# Patient Record
Sex: Female | Born: 1970 | Race: White | Hispanic: No | Marital: Single
Health system: Midwestern US, Community
[De-identification: ages and names within clinical notes are randomized; demographics above are authoritative.]

## PROBLEM LIST (undated history)

## (undated) DIAGNOSIS — S069X9A Unspecified intracranial injury with loss of consciousness of unspecified duration, initial encounter: Secondary | ICD-10-CM

## (undated) DIAGNOSIS — E039 Hypothyroidism, unspecified: Secondary | ICD-10-CM

## (undated) DIAGNOSIS — N200 Calculus of kidney: Secondary | ICD-10-CM

## (undated) DIAGNOSIS — J189 Pneumonia, unspecified organism: Secondary | ICD-10-CM

## (undated) DIAGNOSIS — F4310 Post-traumatic stress disorder, unspecified: Secondary | ICD-10-CM

## (undated) DIAGNOSIS — Z87442 Personal history of urinary calculi: Secondary | ICD-10-CM

## (undated) DIAGNOSIS — T8859XA Other complications of anesthesia, initial encounter: Secondary | ICD-10-CM

## (undated) DIAGNOSIS — R519 Headache, unspecified: Secondary | ICD-10-CM

## (undated) DIAGNOSIS — E161 Other hypoglycemia: Secondary | ICD-10-CM

## (undated) HISTORY — PX: CHOLECYSTECTOMY: SHX55

## (undated) HISTORY — PX: ROOT CANAL: SHX2363

## (undated) HISTORY — PX: KNEE SURGERY: SHX244

## (undated) HISTORY — PX: TONSILLECTOMY: SUR1361

## (undated) HISTORY — PX: MIDDLE EAR SURGERY: SHX713

---
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
## 2012-03-09 HISTORY — PX: CHOLECYSTECTOMY: SHX55

## 2017-11-19 ENCOUNTER — Emergency Department (HOSPITAL_COMMUNITY)
Admission: EM | Admit: 2017-11-19 | Discharge: 2017-11-19 | Disposition: A | Discharge status: Home / Self Care | Payer: Medicare Other | Attending: Emergency Medicine | Admitting: Emergency Medicine

## 2017-11-19 ENCOUNTER — Emergency Department (HOSPITAL_COMMUNITY): Payer: Medicare Other

## 2017-11-19 ENCOUNTER — Encounter (HOSPITAL_COMMUNITY): Payer: Self-pay | Admitting: Emergency Medicine

## 2017-11-19 ENCOUNTER — Ambulatory Visit (HOSPITAL_COMMUNITY)
Admission: EM | Admit: 2017-11-19 | Discharge: 2017-11-19 | Disposition: A | Discharge status: Home / Self Care | Payer: Self-pay

## 2017-11-19 ENCOUNTER — Other Ambulatory Visit: Payer: Self-pay

## 2017-11-19 DIAGNOSIS — R319 Hematuria, unspecified: Secondary | ICD-10-CM

## 2017-11-19 DIAGNOSIS — F17200 Nicotine dependence, unspecified, uncomplicated: Secondary | ICD-10-CM | POA: Insufficient documentation

## 2017-11-19 DIAGNOSIS — N200 Calculus of kidney: Secondary | ICD-10-CM | POA: Diagnosis not present

## 2017-11-19 DIAGNOSIS — N390 Urinary tract infection, site not specified: Secondary | ICD-10-CM | POA: Diagnosis not present

## 2017-11-19 DIAGNOSIS — R109 Unspecified abdominal pain: Secondary | ICD-10-CM | POA: Diagnosis present

## 2017-11-19 DIAGNOSIS — Z9049 Acquired absence of other specified parts of digestive tract: Secondary | ICD-10-CM | POA: Diagnosis not present

## 2017-11-19 HISTORY — DX: Post-traumatic stress disorder, unspecified: F43.10

## 2017-11-19 HISTORY — DX: Calculus of kidney: N20.0

## 2017-11-19 HISTORY — DX: Unspecified intracranial injury with loss of consciousness of unspecified duration, initial encounter: S06.9X9A

## 2017-11-19 LAB — URINALYSIS, ROUTINE W REFLEX MICROSCOPIC
Glucose, UA: NEGATIVE mg/dL
KETONES UR: NEGATIVE mg/dL
Nitrite: NEGATIVE
PH: 5.5 (ref 5.0–8.0)
PROTEIN: 30 mg/dL — AB
Specific Gravity, Urine: >1.03 — ABNORMAL HIGH (ref 1.005–1.030)

## 2017-11-19 LAB — CBC WITH DIFFERENTIAL/PLATELET
Abs Immature Granulocytes: 0.1 10*3/uL (ref 0.0–0.1)
BASOS ABS: 0.1 10*3/uL (ref 0.0–0.1)
BASOS PCT: 1 %
EOS PCT: 3 %
Eosinophils Absolute: 0.3 10*3/uL (ref 0.0–0.7)
HCT: 50.3 % — ABNORMAL HIGH (ref 36.0–46.0)
Hemoglobin: 16.8 g/dL — ABNORMAL HIGH (ref 12.0–15.0)
Immature Granulocytes: 1 %
Lymphocytes Relative: 28 %
Lymphs Abs: 3.1 10*3/uL (ref 0.7–4.0)
MCH: 31.2 pg (ref 26.0–34.0)
MCHC: 33.4 g/dL (ref 30.0–36.0)
MCV: 93.3 fL (ref 78.0–100.0)
MONO ABS: 0.6 10*3/uL (ref 0.1–1.0)
Monocytes Relative: 5 %
Neutro Abs: 6.9 10*3/uL (ref 1.7–7.7)
Neutrophils Relative %: 62 %
PLATELETS: 305 10*3/uL (ref 150–400)
RBC: 5.39 MIL/uL — AB (ref 3.87–5.11)
RDW: 13.4 % (ref 11.5–15.5)
WBC: 11 10*3/uL — AB (ref 4.0–10.5)

## 2017-11-19 LAB — I-STAT BETA HCG BLOOD, ED (MC, WL, AP ONLY)

## 2017-11-19 LAB — BASIC METABOLIC PANEL
Anion gap: 11 (ref 5–15)
BUN: 10 mg/dL (ref 6–20)
CHLORIDE: 109 mmol/L (ref 98–111)
CO2: 21 mmol/L — ABNORMAL LOW (ref 22–32)
Calcium: 9.7 mg/dL (ref 8.9–10.3)
Creatinine, Ser: 0.99 mg/dL (ref 0.44–1.00)
GFR calc non Af Amer: >60 mL/min (ref >60)
Glucose, Bld: 111 mg/dL — ABNORMAL HIGH (ref 70–99)
Potassium: 4.5 mmol/L (ref 3.5–5.1)
SODIUM: 141 mmol/L (ref 135–145)

## 2017-11-19 LAB — URINALYSIS, MICROSCOPIC (REFLEX)

## 2017-11-19 MED ORDER — KETOROLAC TROMETHAMINE 30 MG/ML IJ SOLN
30.0000 mg | Freq: Once | INTRAMUSCULAR | Status: AC
  Administered 2017-11-19: 30 mg via INTRAMUSCULAR
  Filled 2017-11-19: qty 1

## 2017-11-19 MED ORDER — NITROFURANTOIN MONOHYD MACRO 100 MG PO CAPS: 100.0000 mg | ORAL_CAPSULE | Freq: Two times a day (BID) | ORAL | 0 refills | Status: DC

## 2017-11-19 MED ORDER — CEPHALEXIN 500 MG PO CAPS: 500.0000 mg | ORAL_CAPSULE | Freq: Four times a day (QID) | ORAL | 0 refills | Status: DC

## 2017-11-19 MED ORDER — ONDANSETRON HCL 4 MG PO TABS: 4.0000 mg | ORAL_TABLET | Freq: Three times a day (TID) | ORAL | 0 refills | Status: DC | PRN

## 2017-11-19 MED ORDER — OXYCODONE-ACETAMINOPHEN 5-325 MG PO TABS: 1.0000 | ORAL_TABLET | ORAL | 0 refills | Status: DC | PRN

## 2017-11-19 MED ORDER — KETOROLAC TROMETHAMINE 10 MG PO TABS: 10.0000 mg | ORAL_TABLET | Freq: Four times a day (QID) | ORAL | 0 refills | Status: DC | PRN

## 2017-11-19 MED ORDER — TAMSULOSIN HCL 0.4 MG PO CAPS: 0.4000 mg | ORAL_CAPSULE | Freq: Every day | ORAL | 0 refills | Status: DC

## 2017-11-19 NOTE — ED Triage Notes (Signed)
Pt reports burning with urination and hematuria. Pt reports LLQ pain. Pt reports hx of kidney stones. Pt reports nausea, burping, denies vomiting.

## 2017-11-19 NOTE — ED Provider Notes (Signed)
Patient placed in Quick Look pathway, seen and evaluated   Chief Complaint: Left flank pain  HPI: Patient with history of kidney stones presents with acute onset of left lateral flank pain with associated nausea.  No vomiting or fever.  Symptoms remind her of previous kidney stones.  She noted some dark-colored urine yesterday.  She has been treating at home with fluids, cranberry juice, and apple cider vinegar.  ROS:  Positive ROS: (+) Leg pain, nausea Negative ROS: (-) Fever, dysuria, vomiting  Physical Exam:   Gen: No distress  Neuro: Awake and Alert  Skin: Warm    Focused Exam: Heart RRR, nml S1,S2, no m/r/g; Lungs CTAB; Abd soft, mild left lateral abdominal and flank tenderness, no rebound or guarding; Ext 2+ pedal pulses bilaterally, no edema.  BP 112/81 (BP Location: Right Arm)   Pulse (!) 104   Temp (!) 97.5 F (36.4 C) (Oral)   Resp 18   SpO2 96%   Plan: Labs, UA to rule out infection, CT renal.  Initiation of care has begun. The patient has been counseled on the process, plan, and necessity for staying for the completion/evaluation, and the remainder of the medical screening examination    Renne CriglerGeiple, Jaylee Freeze, Cordelia Poche-C 11/19/17 1648    Mesner, Barbara CowerJason, MD 11/19/17 2224

## 2017-11-19 NOTE — ED Notes (Signed)
Pt presents with severe abdominal pain, [facility]

## 2017-11-19 NOTE — ED Provider Notes (Signed)
Emergency Department Provider Note   I have reviewed the triage vital signs and the nursing notes.   HISTORY  Chief Complaint Abdominal Pain   HPI Rebecca Romero is a [age] y.o. female history of kidney stones presents the emergency department today with symptoms consistent with previous kidney stones.  Patient states that she started having urinary tract infection symptoms a few days ago took some cranberry juice but does seem to get better than earlier today showed acute onset of severe back pain that radiated around to her groin.  States it was similar to previous episodes of kidney stones and could not tolerate it so she came here for further evaluation.  At this time she is Artie has some Toradol and she states her symptoms are totally resolved besides the urinary symptoms.  Has some nausea but no vomiting.  No fevers.  She has had some malaise throughout the week but is unclear why.  Has had some intermittent change in urine color but the last time she had it was clear. No other associated or modifying symptoms.    Past Medical History:  Diagnosis Date  . Kidney stone   . PTSD (post-traumatic stress disorder)   . TBI (traumatic brain injury) (HCC)     There are no active problems to display for this patient.   Past Surgical History:  Procedure Laterality Date  . CHOLECYSTECTOMY    . KNEE SURGERY    . MIDDLE EAR SURGERY    . TONSILLECTOMY      Current Outpatient Rx  . Order #: [id number] Class: Print  . Order #: [id number] Class: Print  . Order #: [id number] Class: Print  . Order #: [id number] Class: Print  . Order #: [id number] Class: Print    Allergies Vistaril [hydroxyzine hcl]; Gabapentin; Hydrocodone; Morphine and related; Penicillins; and Sulfa antibiotics  History reviewed. No pertinent family history.  Social History Social History   Tobacco Use  . Smoking status: Current Every Day Smoker    Packs/day: 0.50  . Smokeless tobacco: Never Used  Substance Use  Topics  . Alcohol use: Yes    Comment: occasionally  . Drug use: Never    Review of Systems  All other systems negative except as documented in the HPI. All pertinent positives and negatives as reviewed in the HPI. ____________________________________________   PHYSICAL EXAM:  VITAL SIGNS: ED Triage Vitals  Enc Vitals Group     BP 11/19/17 1634 112/81     Pulse Rate 11/19/17 1634 (!) 104     Resp 11/19/17 1634 18     Temp 11/19/17 1634 (!) 97.5 F (36.4 C)     Temp Source 11/19/17 1634 Oral     SpO2 11/19/17 1634 96 %     Weight --      Height --      Head Circumference --      Peak Flow --      Pain Score 11/19/17 1638 10     Pain Loc --      Pain Edu? --      Excl. in GC? --     Constitutional: Alert and oriented. Well appearing and in no acute distress. Eyes: Conjunctivae are normal. PERRL. EOMI. Head: Atraumatic. Nose: No congestion/rhinnorhea. Mouth/Throat: Mucous membranes are moist.  Oropharynx non-erythematous. Neck: No stridor.  No meningeal signs.   Cardiovascular: Normal rate, regular rhythm. Good peripheral circulation. Grossly normal heart sounds.   Respiratory: Normal respiratory effort.  No retractions. Lungs CTAB. Gastrointestinal: Soft and nontender. No distention.  Musculoskeletal: No lower extremity tenderness nor edema. No gross deformities of extremities. Neurologic:  Normal speech and language. No gross focal neurologic deficits are appreciated.  Skin:  Skin is warm, dry and intact. No rash noted.   ____________________________________________   LABS (all labs ordered are listed, but only abnormal results are displayed)  Labs Reviewed  CBC WITH DIFFERENTIAL/PLATELET - Abnormal; Notable for the following components:      Result Value   WBC 11.0 (*)    RBC 5.39 (*)    Hemoglobin 16.8 (*)    HCT 50.3 (*)    All other components within normal limits  BASIC METABOLIC PANEL - Abnormal; Notable for the following components:   CO2 21 (*)     Glucose, Bld 111 (*)    All other components within normal limits  URINALYSIS, ROUTINE W REFLEX MICROSCOPIC - Abnormal; Notable for the following components:   APPearance CLOUDY (*)    Specific Gravity, Urine >1.030 (*)    Hgb urine dipstick LARGE (*)    Bilirubin Urine SMALL (*)    Protein, ur 30 (*)    Leukocytes, UA TRACE (*)    All other components within normal limits  URINALYSIS, MICROSCOPIC (REFLEX) - Abnormal; Notable for the following components:   Bacteria, UA FEW (*)    All other components within normal limits  I-STAT BETA HCG BLOOD, ED ([facility], [facility], [facility] ONLY)   ____________________________________________  RADIOLOGY  Ct Renal Stone Study  Result Date: 11/19/2017 CLINICAL DATA:  Flank pain.  Suspect stone disease. EXAM: CT ABDOMEN AND PELVIS WITHOUT CONTRAST TECHNIQUE: Multidetector CT imaging of the abdomen and pelvis was performed following the standard protocol without IV contrast. COMPARISON:  None FINDINGS: Lower chest: No acute abnormality. Hepatobiliary: No focal liver abnormality is seen. Status post cholecystectomy. No biliary dilatation. Pancreas: Unremarkable. No pancreatic ductal dilatation or surrounding inflammatory changes. Spleen: Multiple calcified granulomas. Otherwise normal size and appearance Adrenals/Urinary Tract: The adrenal glands appear normal. The right kidney is normal. No kidney stone or hydronephrosis. Mild left-sided nephromegaly and pelvocaliectasis noted. There is a stone at the left UPJ measuring 5 mm, image 57/6. Urinary bladder appears normal. Stomach/Bowel: Stomach is unremarkable. The small bowel loops have a normal course and caliber. Lipoma within the transverse colon is identified measuring 2 cm, image 40/3. Vascular/Lymphatic: Mild aortic atherosclerosis. No aneurysm. No upper abdominal or pelvic adenopathy identified. Reproductive: Right ovary dermoid measures 4.0 by 4.1 by 4.9 cm. Left ovary appears normal. Uterus is unremarkable. Other: No free  fluid or fluid collections. Musculoskeletal: No acute or significant osseous findings. IMPRESSION: 1. Left UPJ calculus is identified measuring 5 mm. This causes left-sided pelvocaliectasis and mild nephromegaly. 2. Right ovary dermoid 3.  Aortic Atherosclerosis (ICD10-I70.0). Electronically Signed   By: Signa Kell M.D.   On: 11/19/2017 17:43    ____________________________________________  INITIAL IMPRESSION / ASSESSMENT AND PLAN / ED COURSE  UTI with pyelonephritis.  Appears uncomplicated at this point.  5 mm to give urology's number in case it does not come out.  Will return here if anything worsens.  Pertinent labs & imaging results that were available during my care of the patient were reviewed by me and considered in my medical decision making (see chart for details).  ____________________________________________  FINAL CLINICAL IMPRESSION(S) / ED DIAGNOSES  Final diagnoses:  Urinary tract infection with hematuria, site unspecified  Kidney stone     MEDICATIONS GIVEN DURING THIS VISIT:  Medications  ketorolac (TORADOL) 30 MG/ML injection 30 mg (30 mg  Intramuscular Given 11/19/17 1908)     NEW OUTPATIENT MEDICATIONS STARTED DURING THIS VISIT:  New Prescriptions   KETOROLAC (TORADOL) 10 MG TABLET    Take 1 tablet (10 mg total) by mouth every 6 (six) hours as needed.   NITROFURANTOIN, MACROCRYSTAL-MONOHYDRATE, (MACROBID) 100 MG CAPSULE    Take 1 capsule (100 mg total) by mouth 2 (two) times daily. X 7 days   ONDANSETRON (ZOFRAN) 4 MG TABLET    Take 1 tablet (4 mg total) by mouth every 8 (eight) hours as needed for nausea or vomiting.   OXYCODONE-ACETAMINOPHEN (PERCOCET) 5-325 MG TABLET    Take 1 tablet by mouth every 4 (four) hours as needed.   TAMSULOSIN (FLOMAX) 0.4 MG CAPS CAPSULE    Take 1 capsule (0.4 mg total) by mouth daily.    Note:  This note was prepared with assistance of Dragon voice recognition software. Occasional wrong-word or sound-a-like substitutions may  have occurred due to the inherent limitations of voice recognition software.   Marily Memos, MD 11/19/17 2226

## 2017-11-24 ENCOUNTER — Other Ambulatory Visit: Payer: Self-pay | Admitting: Urology

## 2017-11-24 ENCOUNTER — Encounter (HOSPITAL_COMMUNITY): Payer: Self-pay | Admitting: General Practice

## 2017-11-29 ENCOUNTER — Ambulatory Visit (HOSPITAL_COMMUNITY): Payer: Medicare Other

## 2017-11-29 ENCOUNTER — Other Ambulatory Visit: Payer: Self-pay

## 2017-11-29 ENCOUNTER — Encounter (HOSPITAL_COMMUNITY): Payer: Self-pay | Admitting: *Deleted

## 2017-11-29 ENCOUNTER — Encounter (HOSPITAL_COMMUNITY)
Admission: RE | Disposition: A | Discharge status: Home / Self Care | Payer: Self-pay | Source: Ambulatory Visit | Attending: Urology

## 2017-11-29 ENCOUNTER — Ambulatory Visit (HOSPITAL_COMMUNITY)
Admission: RE | Admit: 2017-11-29 | Discharge: 2017-11-29 | Disposition: A | Discharge status: Home / Self Care | Payer: Medicare Other | Source: Ambulatory Visit | Attending: Urology | Admitting: Urology

## 2017-11-29 DIAGNOSIS — Z885 Allergy status to narcotic agent status: Secondary | ICD-10-CM | POA: Diagnosis not present

## 2017-11-29 DIAGNOSIS — J45909 Unspecified asthma, uncomplicated: Secondary | ICD-10-CM | POA: Diagnosis not present

## 2017-11-29 DIAGNOSIS — E039 Hypothyroidism, unspecified: Secondary | ICD-10-CM | POA: Insufficient documentation

## 2017-11-29 DIAGNOSIS — Z881 Allergy status to other antibiotic agents status: Secondary | ICD-10-CM | POA: Diagnosis not present

## 2017-11-29 DIAGNOSIS — Z791 Long term (current) use of non-steroidal anti-inflammatories (NSAID): Secondary | ICD-10-CM | POA: Insufficient documentation

## 2017-11-29 DIAGNOSIS — Z87820 Personal history of traumatic brain injury: Secondary | ICD-10-CM | POA: Insufficient documentation

## 2017-11-29 DIAGNOSIS — Z888 Allergy status to other drugs, medicaments and biological substances status: Secondary | ICD-10-CM | POA: Insufficient documentation

## 2017-11-29 DIAGNOSIS — Z880 Allergy status to penicillin: Secondary | ICD-10-CM | POA: Diagnosis not present

## 2017-11-29 DIAGNOSIS — F4310 Post-traumatic stress disorder, unspecified: Secondary | ICD-10-CM | POA: Insufficient documentation

## 2017-11-29 DIAGNOSIS — N201 Calculus of ureter: Secondary | ICD-10-CM | POA: Diagnosis present

## 2017-11-29 DIAGNOSIS — Z7901 Long term (current) use of anticoagulants: Secondary | ICD-10-CM | POA: Insufficient documentation

## 2017-11-29 DIAGNOSIS — Z882 Allergy status to sulfonamides status: Secondary | ICD-10-CM | POA: Diagnosis not present

## 2017-11-29 HISTORY — DX: Personal history of urinary calculi: Z87.442

## 2017-11-29 HISTORY — DX: Other hypoglycemia: E16.1

## 2017-11-29 HISTORY — PX: EXTRACORPOREAL SHOCK WAVE LITHOTRIPSY: SHX1557

## 2017-11-29 HISTORY — DX: Hypothyroidism, unspecified: E03.9

## 2017-11-29 LAB — PREGNANCY, URINE: Preg Test, Ur: NEGATIVE

## 2017-11-29 SURGERY — LITHOTRIPSY, ESWL
Anesthesia: LOCAL | Laterality: Left

## 2017-11-29 MED ORDER — DIAZEPAM 5 MG PO TABS
10.0000 mg | ORAL_TABLET | ORAL | Status: AC
  Administered 2017-11-29: 10 mg via ORAL
  Filled 2017-11-29: qty 2

## 2017-11-29 MED ORDER — SODIUM CHLORIDE 0.9 % IV SOLN
INTRAVENOUS | Status: DC
  Administered 2017-11-29: 10:00:00 via INTRAVENOUS

## 2017-11-29 MED ORDER — CIPROFLOXACIN HCL 500 MG PO TABS
500.0000 mg | ORAL_TABLET | ORAL | Status: AC
  Administered 2017-11-29: 500 mg via ORAL
  Filled 2017-11-29: qty 1

## 2017-11-29 MED ORDER — HYDROMORPHONE HCL 1 MG/ML IJ SOLN: 1.0000 mg | Freq: Once | INTRAMUSCULAR | Status: DC

## 2017-11-29 MED ORDER — HYDROMORPHONE HCL 1 MG/ML IJ SOLN: 1.0000 mg | Freq: Once | INTRAMUSCULAR | Status: DC | PRN

## 2017-11-29 MED ORDER — DIPHENHYDRAMINE HCL 25 MG PO CAPS
25.0000 mg | ORAL_CAPSULE | ORAL | Status: AC
  Administered 2017-11-29: 25 mg via ORAL
  Filled 2017-11-29: qty 1

## 2017-11-29 NOTE — Op Note (Signed)
See [facility] OP note scanned into chart. Also because of the size, density, location and other factors that cannot be anticipated I feel this will likely be a staged procedure. This fact supersedes any indication in the scanned [facility] operative note to the contrary.  

## 2017-11-29 NOTE — H&P (Signed)
See scanned H&P

## 2017-11-30 ENCOUNTER — Encounter (HOSPITAL_COMMUNITY): Payer: Self-pay | Admitting: Urology

## 2019-03-02 ENCOUNTER — Ambulatory Visit: Payer: Medicare Other | Attending: Internal Medicine

## 2019-03-02 DIAGNOSIS — Z20822 Contact with and (suspected) exposure to covid-19: Secondary | ICD-10-CM

## 2019-03-04 LAB — NOVEL CORONAVIRUS, NAA: SARS-CoV-2, NAA: NOT DETECTED

## 2019-10-13 ENCOUNTER — Ambulatory Visit: Payer: Self-pay | Admitting: Surgery
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
## 2019-10-14 ENCOUNTER — Other Ambulatory Visit (HOSPITAL_COMMUNITY)
Admission: RE | Admit: 2019-10-14 | Discharge: 2019-10-14 | Disposition: A | Discharge status: Home / Self Care | Payer: Medicare HMO | Source: Ambulatory Visit | Attending: Orthopedic Surgery | Admitting: Orthopedic Surgery

## 2019-10-14 DIAGNOSIS — Z01812 Encounter for preprocedural laboratory examination: Secondary | ICD-10-CM | POA: Diagnosis present

## 2019-10-14 DIAGNOSIS — Z20822 Contact with and (suspected) exposure to covid-19: Secondary | ICD-10-CM | POA: Insufficient documentation

## 2019-10-14 LAB — SARS CORONAVIRUS 2 (TAT 6-24 HRS): SARS Coronavirus 2: NEGATIVE

## 2019-10-16 ENCOUNTER — Encounter (HOSPITAL_COMMUNITY): Payer: Self-pay
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
## 2019-10-16 NOTE — Patient Instructions (Signed)
DUE TO COVID-19 ONLY ONE VISITOR IS ALLOWED TO COME WITH YOU AND STAY IN THE WAITING ROOM ONLY DURING PRE OP AND PROCEDURE DAY OF SURGERY. THE 1 VISITOR  MAY VISIT WITH YOU AFTER SURGERY IN YOUR PRIVATE ROOM DURING VISITING HOURS ONLY!  YOU NEED TO HAVE A COVID 19 TEST ON_8-7______ @_______ , THIS TEST MUST BE DONE BEFORE SURGERY,  COVID TESTING SITE [address] [place] , IT IS ON THE RIGHT GOING OUT [address] APPROXIMATELY  2 MINUTES PAST [employer] ON THE RIGHT. ONCE YOUR COVID TEST IS COMPLETED,  PLEASE BEGIN THE QUARANTINE INSTRUCTIONS AS OUTLINED IN YOUR HANDOUT.                Rebecca Romero  10/16/2019   Your procedure is scheduled on: 10-18-19   Report to [facility] Main  Entrance   Report to admitting at       1020 AM     Call this number if you have problems the morning of surgery [phone]    Remember: ONE FLEETS ENEMA THE NIGHT BEFORE SURGERY                                       ONE FLEETS ENEMA THE AM OF SURGERY  Do not eat food or drink liquids :After Midnight.   BRUSH YOUR TEETH MORNING OF SURGERY AND RINSE YOUR MOUTH OUT, NO CHEWING GUM CANDY OR MINTS.     Take these medicines the morning of surgery with A SIP OF WATER: NONE                                 You may not have any metal on your body including hair pins and              piercings  Do not wear jewelry, make-up, lotions, powders or perfumes, deodorant             Do not wear nail polish on your fingernails.  Do not shave  48 hours prior to surgery.     Do not bring valuables to the hospital. [name] IS NOT             RESPONSIBLE   FOR VALUABLES.  Contacts, dentures or bridgework may not be worn into surgery.      Patients discharged the day of surgery will not be allowed to drive home. IF YOU ARE HAVING SURGERY AND GOING HOME THE SAME DAY, YOU MUST HAVE AN ADULT TO DRIVE YOU HOME AND BE WITH YOU FOR 24 HOURS. YOU MAY GO HOME BY TAXI OR UBER OR  ORTHERWISE, BUT AN ADULT MUST ACCOMPANY YOU HOME AND STAY WITH YOU FOR 24 HOURS.  Name and phone number of your driver:  Special Instructions: N/A              Please read over the following fact sheets you were given: _____________________________________________________________________             [facility] - Preparing for Surgery Before surgery, you can play an important role.  Because skin is not sterile, your skin needs to be as free of germs as possible.  You can reduce the number of germs on your skin by washing with CHG (chlorahexidine gluconate) soap before surgery.  CHG is an antiseptic cleaner which kills  germs and bonds with the skin to continue killing germs even after washing. Please DO NOT use if you have an allergy to CHG or antibacterial soaps.  If your skin becomes reddened/irritated stop using the CHG and inform your nurse when you arrive at Short Stay. Do not shave (including legs and underarms) for at least 48 hours prior to the first CHG shower.  You may shave your face/neck. Please follow these instructions carefully:  1.  Shower with CHG Soap the night before surgery and the  morning of Surgery.  2.  If you choose to wash your hair, wash your hair first as usual with your  normal  shampoo.  3.  After you shampoo, rinse your hair and body thoroughly to remove the  shampoo.                           4.  Use CHG as you would any other liquid soap.  You can apply chg directly  to the skin and wash                       Gently with a scrungie or clean washcloth.  5.  Apply the CHG Soap to your body ONLY FROM THE NECK DOWN.   Do not use on face/ open                           Wound or open sores. Avoid contact with eyes, ears mouth and genitals (private parts).                       Wash face,  Genitals (private parts) with your normal soap.             6.  Wash thoroughly, paying special attention to the area where your surgery  will be performed.  7.  Thoroughly rinse  your body with warm water from the neck down.  8.  DO NOT shower/wash with your normal soap after using and rinsing off  the CHG Soap.                9.  Pat yourself dry with a clean towel.            10.  Wear clean pajamas.            11.  Place clean sheets on your bed the night of your first shower and do not  sleep with pets. Day of Surgery : Do not apply any lotions/deodorants the morning of surgery.  Please wear clean clothes to the hospital/surgery center.  FAILURE TO FOLLOW THESE INSTRUCTIONS MAY RESULT IN THE CANCELLATION OF YOUR SURGERY PATIENT SIGNATURE_________________________________  NURSE SIGNATURE__________________________________  ________________________________________________________________________

## 2019-10-17 ENCOUNTER — Encounter (HOSPITAL_COMMUNITY)
Admission: RE | Admit: 2019-10-17 | Discharge: 2019-10-17 | Disposition: A | Discharge status: Home / Self Care | Payer: Medicare HMO | Source: Ambulatory Visit | Attending: Surgery | Admitting: Surgery

## 2019-10-17 ENCOUNTER — Other Ambulatory Visit: Payer: Self-pay

## 2019-10-17 ENCOUNTER — Encounter (HOSPITAL_COMMUNITY): Payer: Self-pay

## 2019-10-17 DIAGNOSIS — Z01812 Encounter for preprocedural laboratory examination: Secondary | ICD-10-CM | POA: Insufficient documentation

## 2019-10-17 HISTORY — DX: Pneumonia, unspecified organism: J18.9

## 2019-10-17 HISTORY — DX: Other complications of anesthesia, initial encounter: T88.59XA

## 2019-10-17 LAB — CBC WITH DIFFERENTIAL/PLATELET
Abs Immature Granulocytes: 0.06 10*3/uL (ref 0.00–0.07)
Basophils Absolute: 0.1 10*3/uL (ref 0.0–0.1)
Basophils Relative: 1 %
Eosinophils Absolute: 0.3 10*3/uL (ref 0.0–0.5)
Eosinophils Relative: 3 %
HCT: 47.2 % — ABNORMAL HIGH (ref 36.0–46.0)
Hemoglobin: 15.8 g/dL — ABNORMAL HIGH (ref 12.0–15.0)
Immature Granulocytes: 1 %
Lymphocytes Relative: 31 %
Lymphs Abs: 3.3 10*3/uL (ref 0.7–4.0)
MCH: 30.6 pg (ref 26.0–34.0)
MCHC: 33.5 g/dL (ref 30.0–36.0)
MCV: 91.3 fL (ref 80.0–100.0)
Monocytes Absolute: 0.7 10*3/uL (ref 0.1–1.0)
Monocytes Relative: 7 %
Neutro Abs: 6.1 10*3/uL (ref 1.7–7.7)
Neutrophils Relative %: 57 %
Platelets: 281 10*3/uL (ref 150–400)
RBC: 5.17 MIL/uL — ABNORMAL HIGH (ref 3.87–5.11)
RDW: 13.2 % (ref 11.5–15.5)
WBC: 10.6 10*3/uL — ABNORMAL HIGH (ref 4.0–10.5)
nRBC: 0 % (ref 0.0–0.2)

## 2019-10-17 LAB — PROTIME-INR
INR: 0.9 (ref 0.8–1.2)
Prothrombin Time: 11.5 seconds (ref 11.4–15.2)

## 2019-10-17 LAB — COMPREHENSIVE METABOLIC PANEL
ALT: 25 U/L (ref 0–44)
AST: 23 U/L (ref 15–41)
Albumin: 3.9 g/dL (ref 3.5–5.0)
Alkaline Phosphatase: 100 U/L (ref 38–126)
Anion gap: 12 (ref 5–15)
BUN: 19 mg/dL (ref 6–20)
CO2: 23 mmol/L (ref 22–32)
Calcium: 8.9 mg/dL (ref 8.9–10.3)
Chloride: 107 mmol/L (ref 98–111)
Creatinine, Ser: 0.8 mg/dL (ref 0.44–1.00)
GFR calc Af Amer: >60 mL/min (ref >60)
GFR calc non Af Amer: >60 mL/min (ref >60)
Glucose, Bld: 141 mg/dL — ABNORMAL HIGH (ref 70–99)
Potassium: 4.2 mmol/L (ref 3.5–5.1)
Sodium: 142 mmol/L (ref 135–145)
Total Bilirubin: 0.4 mg/dL (ref 0.3–1.2)
Total Protein: 7 g/dL (ref 6.5–8.1)

## 2019-10-17 LAB — APTT: aPTT: 39 seconds — ABNORMAL HIGH (ref 24–36)

## 2019-10-17 MED ORDER — BUPIVACAINE LIPOSOME 1.3 % IJ SUSP
20.0000 mL | Freq: Once | INTRAMUSCULAR | Status: DC
  Filled 2019-10-17: qty 20

## 2019-10-17 NOTE — Progress Notes (Signed)
Pt. Takes Ashwaganda for PTSD advised to stop today  per anesthesia and to call surgeon if any further questions. Per Jodell Cipro PA-C

## 2019-10-18 ENCOUNTER — Encounter (HOSPITAL_COMMUNITY): Payer: Self-pay | Admitting: Surgery

## 2019-10-18 ENCOUNTER — Ambulatory Visit (HOSPITAL_COMMUNITY): Payer: Medicare HMO | Admitting: Anesthesiology

## 2019-10-18 ENCOUNTER — Other Ambulatory Visit: Payer: Self-pay

## 2019-10-18 ENCOUNTER — Encounter (HOSPITAL_COMMUNITY)
Admission: RE | Disposition: A | Discharge status: Home / Self Care | Payer: Self-pay | Source: Home / Self Care | Attending: Surgery

## 2019-10-18 ENCOUNTER — Ambulatory Visit (HOSPITAL_COMMUNITY)
Admission: RE | Admit: 2019-10-18 | Discharge: 2019-10-18 | Disposition: A | Discharge status: Home / Self Care | Payer: Medicare HMO | Attending: Surgery | Admitting: Surgery

## 2019-10-18 DIAGNOSIS — K644 Residual hemorrhoidal skin tags: Secondary | ICD-10-CM | POA: Insufficient documentation

## 2019-10-18 DIAGNOSIS — Z885 Allergy status to narcotic agent status: Secondary | ICD-10-CM | POA: Diagnosis not present

## 2019-10-18 DIAGNOSIS — K648 Other hemorrhoids: Secondary | ICD-10-CM | POA: Diagnosis not present

## 2019-10-18 DIAGNOSIS — Z882 Allergy status to sulfonamides status: Secondary | ICD-10-CM | POA: Diagnosis not present

## 2019-10-18 DIAGNOSIS — Z87820 Personal history of traumatic brain injury: Secondary | ICD-10-CM | POA: Insufficient documentation

## 2019-10-18 DIAGNOSIS — K603 Anal fistula: Secondary | ICD-10-CM | POA: Insufficient documentation

## 2019-10-18 DIAGNOSIS — Z880 Allergy status to penicillin: Secondary | ICD-10-CM | POA: Insufficient documentation

## 2019-10-18 DIAGNOSIS — Z888 Allergy status to other drugs, medicaments and biological substances status: Secondary | ICD-10-CM | POA: Diagnosis not present

## 2019-10-18 DIAGNOSIS — Z881 Allergy status to other antibiotic agents status: Secondary | ICD-10-CM | POA: Insufficient documentation

## 2019-10-18 DIAGNOSIS — Z6841 Body Mass Index (BMI) 40.0 and over, adult: Secondary | ICD-10-CM | POA: Insufficient documentation

## 2019-10-18 DIAGNOSIS — F17210 Nicotine dependence, cigarettes, uncomplicated: Secondary | ICD-10-CM | POA: Insufficient documentation

## 2019-10-18 DIAGNOSIS — K610 Anal abscess: Secondary | ICD-10-CM | POA: Diagnosis present

## 2019-10-18 HISTORY — PX: RECTAL EXAM UNDER ANESTHESIA: SHX6399

## 2019-10-18 HISTORY — PX: PLACEMENT OF SETON: SHX6029

## 2019-10-18 LAB — PREGNANCY, URINE: Preg Test, Ur: NEGATIVE

## 2019-10-18 SURGERY — EXAM UNDER ANESTHESIA, RECTUM
Anesthesia: General

## 2019-10-18 MED ORDER — FENTANYL CITRATE (PF) 100 MCG/2ML IJ SOLN
INTRAMUSCULAR | Status: AC
  Filled 2019-10-18: qty 2

## 2019-10-18 MED ORDER — TRAMADOL HCL 50 MG PO TABS: 50.0000 mg | ORAL_TABLET | Freq: Four times a day (QID) | ORAL | 0 refills | Status: AC | PRN

## 2019-10-18 MED ORDER — SODIUM CHLORIDE 0.9 % IR SOLN
Status: DC | PRN
  Administered 2019-10-18: 1000 mL

## 2019-10-18 MED ORDER — ACETAMINOPHEN 500 MG PO TABS
1000.0000 mg | ORAL_TABLET | ORAL | Status: AC
  Administered 2019-10-18: 1000 mg via ORAL
  Filled 2019-10-18: qty 2

## 2019-10-18 MED ORDER — ONDANSETRON HCL 4 MG/2ML IJ SOLN
INTRAMUSCULAR | Status: AC
  Filled 2019-10-18: qty 2

## 2019-10-18 MED ORDER — HYDROGEN PEROXIDE 3 % EX SOLN
CUTANEOUS | Status: DC | PRN
  Administered 2019-10-18: 1

## 2019-10-18 MED ORDER — PROPOFOL 10 MG/ML IV BOLUS
INTRAVENOUS | Status: DC | PRN
  Administered 2019-10-18: 200 mg via INTRAVENOUS

## 2019-10-18 MED ORDER — IBUPROFEN 200 MG PO TABS
600.0000 mg | ORAL_TABLET | Freq: Once | ORAL | Status: AC | PRN
  Administered 2019-10-18: 600 mg via ORAL

## 2019-10-18 MED ORDER — CHLORHEXIDINE GLUCONATE 0.12 % MT SOLN
15.0000 mL | Freq: Once | OROMUCOSAL | Status: AC
  Administered 2019-10-18: 15 mL via OROMUCOSAL

## 2019-10-18 MED ORDER — BUPIVACAINE-EPINEPHRINE (PF) 0.25% -1:200000 IJ SOLN
INTRAMUSCULAR | Status: DC | PRN
  Administered 2019-10-18: 30 mL

## 2019-10-18 MED ORDER — ORAL CARE MOUTH RINSE: 15.0000 mL | Freq: Once | OROMUCOSAL | Status: AC

## 2019-10-18 MED ORDER — SUGAMMADEX SODIUM 500 MG/5ML IV SOLN
INTRAVENOUS | Status: AC
  Filled 2019-10-18: qty 5

## 2019-10-18 MED ORDER — DEXMEDETOMIDINE (PRECEDEX) IN NS 20 MCG/5ML (4 MCG/ML) IV SYRINGE
PREFILLED_SYRINGE | INTRAVENOUS | Status: AC
  Filled 2019-10-18: qty 5

## 2019-10-18 MED ORDER — BUPIVACAINE LIPOSOME 1.3 % IJ SUSP
INTRAMUSCULAR | Status: DC | PRN
  Administered 2019-10-18: 20 mL

## 2019-10-18 MED ORDER — LACTATED RINGERS IV SOLN: INTRAVENOUS | Status: DC

## 2019-10-18 MED ORDER — MIDAZOLAM HCL 2 MG/2ML IJ SOLN
INTRAMUSCULAR | Status: DC | PRN
  Administered 2019-10-18: 2 mg via INTRAVENOUS

## 2019-10-18 MED ORDER — LIDOCAINE 2% (20 MG/ML) 5 ML SYRINGE
INTRAMUSCULAR | Status: AC
  Filled 2019-10-18: qty 5

## 2019-10-18 MED ORDER — ONDANSETRON HCL 4 MG/2ML IJ SOLN
INTRAMUSCULAR | Status: DC | PRN
  Administered 2019-10-18: 4 mg via INTRAVENOUS

## 2019-10-18 MED ORDER — FENTANYL CITRATE (PF) 100 MCG/2ML IJ SOLN
INTRAMUSCULAR | Status: DC | PRN
  Administered 2019-10-18: 50 ug via INTRAVENOUS
  Administered 2019-10-18: 100 ug via INTRAVENOUS
  Administered 2019-10-18: 50 ug via INTRAVENOUS

## 2019-10-18 MED ORDER — HYDROMORPHONE HCL 1 MG/ML IJ SOLN: 0.2500 mg | INTRAMUSCULAR | Status: DC | PRN

## 2019-10-18 MED ORDER — ACETAMINOPHEN 325 MG PO TABS
650.0000 mg | ORAL_TABLET | Freq: Once | ORAL | Status: AC | PRN
  Administered 2019-10-18: 650 mg via ORAL

## 2019-10-18 MED ORDER — DEXAMETHASONE SODIUM PHOSPHATE 10 MG/ML IJ SOLN
INTRAMUSCULAR | Status: DC | PRN
  Administered 2019-10-18: 10 mg via INTRAVENOUS

## 2019-10-18 MED ORDER — ROCURONIUM BROMIDE 10 MG/ML (PF) SYRINGE
PREFILLED_SYRINGE | INTRAVENOUS | Status: DC | PRN
  Administered 2019-10-18: 50 mg via INTRAVENOUS

## 2019-10-18 MED ORDER — DEXMEDETOMIDINE (PRECEDEX) IN NS 20 MCG/5ML (4 MCG/ML) IV SYRINGE
PREFILLED_SYRINGE | INTRAVENOUS | Status: DC | PRN
  Administered 2019-10-18: 8 ug via INTRAVENOUS
  Administered 2019-10-18: 12 ug via INTRAVENOUS

## 2019-10-18 MED ORDER — HYDROGEN PEROXIDE 3 % EX SOLN
CUTANEOUS | Status: AC
  Filled 2019-10-18: qty 473

## 2019-10-18 MED ORDER — CHLORHEXIDINE GLUCONATE CLOTH 2 % EX PADS
6.0000 | MEDICATED_PAD | Freq: Once | CUTANEOUS | Status: AC
  Administered 2019-10-18: 6 via TOPICAL

## 2019-10-18 MED ORDER — DEXAMETHASONE SODIUM PHOSPHATE 10 MG/ML IJ SOLN
INTRAMUSCULAR | Status: AC
  Filled 2019-10-18: qty 1

## 2019-10-18 MED ORDER — PROMETHAZINE HCL 25 MG/ML IJ SOLN: 6.2500 mg | INTRAMUSCULAR | Status: DC | PRN

## 2019-10-18 MED ORDER — PROPOFOL 10 MG/ML IV BOLUS
INTRAVENOUS | Status: AC
  Filled 2019-10-18: qty 20

## 2019-10-18 MED ORDER — SUGAMMADEX SODIUM 200 MG/2ML IV SOLN
INTRAVENOUS | Status: DC | PRN
  Administered 2019-10-18: 300 mg via INTRAVENOUS

## 2019-10-18 MED ORDER — MIDAZOLAM HCL 2 MG/2ML IJ SOLN
INTRAMUSCULAR | Status: AC
  Filled 2019-10-18: qty 2

## 2019-10-18 MED ORDER — LIDOCAINE 2% (20 MG/ML) 5 ML SYRINGE
INTRAMUSCULAR | Status: DC | PRN
  Administered 2019-10-18: 100 mg via INTRAVENOUS

## 2019-10-18 MED ORDER — ACETAMINOPHEN 325 MG PO TABS
ORAL_TABLET | ORAL | Status: AC
  Filled 2019-10-18: qty 2

## 2019-10-18 MED ORDER — ROCURONIUM BROMIDE 10 MG/ML (PF) SYRINGE
PREFILLED_SYRINGE | INTRAVENOUS | Status: AC
  Filled 2019-10-18: qty 10

## 2019-10-18 MED ORDER — BUPIVACAINE-EPINEPHRINE (PF) 0.25% -1:200000 IJ SOLN
INTRAMUSCULAR | Status: AC
  Filled 2019-10-18: qty 30

## 2019-10-18 MED ORDER — IBUPROFEN 200 MG PO TABS
ORAL_TABLET | ORAL | Status: AC
  Filled 2019-10-18: qty 3

## 2019-10-18 SURGICAL SUPPLY — 25 items
BRIEF STRETCH FOR OB PAD LRG (UNDERPADS AND DIAPERS) ×2 IMPLANT
COVER WAND RF STERILE (DRAPES) IMPLANT
DECANTER SPIKE VIAL GLASS SM (MISCELLANEOUS) ×2 IMPLANT
DRSG PAD ABDOMINAL 8X10 ST (GAUZE/BANDAGES/DRESSINGS) ×2 IMPLANT
ELECT REM PT RETURN 15FT ADLT (MISCELLANEOUS) ×2 IMPLANT
GAUZE SPONGE 4X4 12PLY STRL (GAUZE/BANDAGES/DRESSINGS) ×2 IMPLANT
GLOVE BIO SURGEON STRL SZ7.5 (GLOVE) ×2 IMPLANT
GLOVE INDICATOR 8.0 STRL GRN (GLOVE) ×2 IMPLANT
GOWN STRL REUS W/TWL XL LVL3 (GOWN DISPOSABLE) ×4 IMPLANT
KIT BASIN OR (CUSTOM PROCEDURE TRAY) ×2 IMPLANT
KIT TURNOVER KIT A (KITS) ×2 IMPLANT
NEEDLE HYPO 22GX1.5 SAFETY (NEEDLE) ×2 IMPLANT
PACK GENERAL/GYN (CUSTOM PROCEDURE TRAY) ×2 IMPLANT
PENCIL SMOKE EVACUATOR (MISCELLANEOUS) ×2 IMPLANT
SHEARS HARMONIC 9CM CVD (BLADE) IMPLANT
SPONGE SURGIFOAM ABS GEL 100 (HEMOSTASIS) IMPLANT
SURGILUBE 2OZ TUBE FLIPTOP (MISCELLANEOUS) ×2 IMPLANT
SUT CHROMIC 2 0 SH (SUTURE) IMPLANT
SUT CHROMIC 3 0 SH 27 (SUTURE) IMPLANT
SUT MNCRL AB 4-0 PS2 18 (SUTURE) ×2 IMPLANT
SUT VIC AB 3-0 SH 27 (SUTURE) ×2
SUT VIC AB 3-0 SH 27X BRD (SUTURE) ×1 IMPLANT
SYR 20ML LL LF (SYRINGE) ×2 IMPLANT
TOWEL OR 17X26 10 PK STRL BLUE (TOWEL DISPOSABLE) ×2 IMPLANT
TOWEL OR NON WOVEN STRL DISP B (DISPOSABLE) ×2 IMPLANT

## 2019-10-18 NOTE — Op Note (Signed)
10/18/2019  12:58 PM  PATIENT:  Rebecca Romero  49 y.o. female  Patient Care Team: Patient, No Pcp Per as PCP - General (General Practice)  PRE-OPERATIVE DIAGNOSIS:  Probable anal fistula, history of perirectal abscess  POST-OPERATIVE DIAGNOSIS:  Low trans-sphincteric anal fistula  PROCEDURE:   1. Partial fistulotomy 2. Placement of seton 3. Anorectal exam under anesthesia  SURGEON:  Surgeon(s): Ileana Roup, MD  ANESTHESIA:   local and general  SPECIMEN:  No Specimen  DISPOSITION OF SPECIMEN:  N/A  COUNTS:  Sponge, needle, and instrument counts were reported correct x2 at conclusion.  EBL: 2 mL  Drains: None  PLAN OF CARE: Discharge to home after PACU  PATIENT DISPOSITION:  PACU - hemodynamically stable.  INDICATION: [name] is a pleasant 49yo female with a 4 to 5-year history of recurring abscesses on the right side of the buttock.  They were flaring up every 6 months.  She was seen in our office and had an abscess drained by one of my partners..  She was ultimately referred to see me for a possible perianal fistula.  We met and discussed everything in the office.  She does have evidence of what appears to be an external opening in the right posterolateral buttock.  We discussed options going forward and she opted to pursue surgery.  Please refer to notes elsewhere for details regarding these discussions.  OR FINDINGS: Normal-appearing anal canal without ulceration or obvious granulation tissue. Small external and internal hemorrhoids. External opening in the right posterior lateral buttock.  Ultimately, found to have a low transsphincteric fistula with internal opening in the posterior midline just distal to the dentate.  A partial fistulotomy was carried out.  A blue vessel loop seton was placed.  DESCRIPTION: The patient was identified in the preoperative holding area and taken to the OR. SCDs were applied. She then underwent general endotracheal  anesthesia without difficulty. The patient was then rolled onto the OR table in the prone jackknife position. Pressure points were then evaluated and padded. The buttocks were gently taped apart.  She was then prepped and draped in usual sterile fashion.  A surgical timeout was performed indicating the correct patient, procedure, and positioning.  A perianal block was then created using a dilute mixture of 0.25% Marcaine with epinephrine and Exparel.  After ascertaining an appropriate level of anesthesia had been achieved, a well lubricated digital rectal exam was performed. This demonstrated a normal anorectal exam.  A Hill-Ferguson anoscope was into the anal canal and circumferential anoscopy demonstrated a normal appearing anus without evident granulation tissue, stricture or ulcerations.  Small internal/external hemorrhoids.  The external opening to this fistula was located in the right posterior lateral buttock approximately 5 cm from the anal verge.  This was carefully probed multiple times with a fistula probe, taking care to not create any false passages.  I was unable to definitively identify the internal opening.  The probe was repositioned with a curvilinear approach.  I was still is unable to find an internal opening.  A small volume of dilute hydrogen peroxide was then instilled using an 18-gauge angiocatheter from the external opening and there was extravasation of peroxide in the posterior midline anal canal just distal to the dentate line.  I ultimately able to carefully cannulate this with the fistula probe.  The tract was palpated and ran fairly superficially towards the anal verge however as it approached the sphincter did dive into a low transsphincteric position.  A partial fistulotomy  was carried out with electrocautery.  No sphincter muscle was divided.  Given the findings, the decision was made to proceed with placement of seton to allow the tract to mature and any secondary tract to  ideally collapse if present.  A blue vessel loop seton was passed and secured to itself with 2-0 silk suture creating a draining seton.  The anus was irrigated and hemostasis verified.  A dressing consisting of 4 x 4's, ABD, and mesh underwear was placed.  She was then taken out of the prone position, rolled back onto a stretcher, awakened from anesthesia, extubated, and transported to PACU in satisfactory condition.  DISPOSITION: PACU in satisfactory condition.

## 2019-10-18 NOTE — Interval H&P Note (Signed)
History and Physical Interval Note:  10/18/2019 10:56 AM  Rebecca Romero  has presented today for surgery, with the diagnosis of ANAL ABCESS.  The various methods of treatment have been discussed with the patient and family. After consideration of risks, benefits and other options for treatment, the patient has consented to  Procedure(s): ANORECTAL EXAM UNDER ANESTHESIA (N/A) POSSIBLE SETON , POSSIBLE FISTULOTOMY (N/A) POSSIBLE INCISION AND DRAINAGE (N/A) as a surgical intervention.  The patient's history has been reviewed, patient examined, no change in status, stable for surgery.  I have reviewed the patient's chart and labs.  Questions were answered to the patient's satisfaction.     Stephanie Coup Marcelene Weidemann

## 2019-10-18 NOTE — Anesthesia Postprocedure Evaluation (Deleted)
Anesthesia Post Note  Patient: Rebecca Romero  Procedure(s) Performed: ANORECTAL EXAM UNDER ANESTHESIA (N/A ) draining SETON placement , partial FISTULOTOMY (N/A )     Patient location during evaluation: PACU Anesthesia Type: General Level of consciousness: awake and alert Pain management: pain level controlled Vital Signs Assessment: post-procedure vital signs reviewed and stable Respiratory status: spontaneous breathing, nonlabored ventilation, respiratory function stable and patient connected to nasal cannula oxygen Cardiovascular status: blood pressure returned to baseline and stable Postop Assessment: no apparent nausea or vomiting Anesthetic complications: no Comments: Patient only has someone to stay with her for 2 hours at home. Was brief case, patient not requiring narcotics. Totally awake. OK to go home after 4 hour stay in phase 2   No complications documented.  Last Vitals:  Vitals:   10/18/19 1542 10/18/19 1615  BP: 119/87 138/80  Pulse: 66 80  Resp: 16 16  Temp: 36.7 C 36.8 C  SpO2: 95% 97%    Last Pain:  Vitals:   10/18/19 1630  TempSrc:   PainSc: 1                  [name] S

## 2019-10-18 NOTE — Addendum Note (Signed)
Addendum  created 10/18/19 1715 by Eilene Ghazi, MD   Clinical Note Signed, Delete clinical note

## 2019-10-18 NOTE — Anesthesia Postprocedure Evaluation (Signed)
Anesthesia Post Note  Patient: Denasia Maddy  Procedure(s) Performed: ANORECTAL EXAM UNDER ANESTHESIA (N/A ) draining SETON placement , partial FISTULOTOMY (N/A )     Patient location during evaluation: PACU Anesthesia Type: General Level of consciousness: awake and alert Pain management: pain level controlled Vital Signs Assessment: post-procedure vital signs reviewed and stable Respiratory status: spontaneous breathing, nonlabored ventilation, respiratory function stable and patient connected to nasal cannula oxygen Cardiovascular status: blood pressure returned to baseline and stable Postop Assessment: no apparent nausea or vomiting Anesthetic complications: no Comments: Patient only has someone to stay with her for 2 hours once home. This was a brief case, patient is not requiring narcotics.  OK to go home after 4 hour  stay in PACU/phase 2.   Patient totally awake, OK to go home   No complications documented.  Last Vitals:  Vitals:   10/18/19 1542 10/18/19 1615  BP: 119/87 138/80  Pulse: 66 80  Resp: 16 16  Temp: 36.7 C 36.8 C  SpO2: 95% 97%    Last Pain:  Vitals:   10/18/19 1630  TempSrc:   PainSc: 1                  Tramain Gershman S

## 2019-10-18 NOTE — Discharge Instructions (Addendum)
ANORECTAL SURGERY: POST OP INSTRUCTIONS A draining seton was placed for this fistula. A 'partial' fistulotomy was carried out to shorten the tract length as well.   1. DIET: Follow a light bland diet the first 24 hours after arrival home, such as soup, liquids, crackers, etc.  Be sure to include lots of fluids daily.  Avoid fast food or heavy meals as your are more likely to get nauseated.  Eat a low fat diet the next few days after surgery.   2. Some bleeding with bowel movements is expected for the first couple of days but this should stop in between bowel movements  3. Take your usually prescribed home medications unless otherwise directed.  4. PAIN CONTROL: a. It is helpful to take an over-the-counter pain medication regularly for the first few days/weeks.  Choose from the following that works best for you: i. Ibuprofen (Advil, etc) Three 200mg  tabs every 6 hours as needed. ii. Acetaminophen (Tylenol, etc) 500-650mg  every 6 hours as needed iii. NOTE: You may take both of these medications together - most patients find it most helpful when alternating between the two (i.e. Ibuprofen at 6am, tylenol at 9am, ibuprofen at 12pm ...) b. A  prescription for pain medication may have been prescribed for you at discharge.  Take your pain medication as prescribed.  i. If you are having problems/concerns with the prescription medicine, please call for further advice.  5. Avoid getting constipated.  Between the surgery and the pain medications, it is common to experience some constipation.  Increasing fluid intake (64oz of water per day) and taking a fiber supplement (such as Metamucil, Citrucel, FiberCon) 1-2 times a day regularly will usually help prevent this problem from occurring.  Take Miralax (over the counter) 1-2x/day while taking a narcotic pain medication. If no bowel movement after 48hours, you may additionally take a laxative like a bottle of Milk of Magnesia which can be purchased over the  counter. Avoid enemas if possible as these are often painful.   6. Watch out for diarrhea.  If you have many loose bowel movements, simplify your diet to bland foods.  Stop any stool softeners and decrease your fiber supplement. If this worsens or does not improve, please call us.  7. Wash / shower every day.  If you were discharged with a dressing, you may remove this the day after your surgery. You may shower normally, getting soap/water on your wound, particularly after bowel movements.  8. Soaking in a warm bath filled a couple inches ("Sitz bath") is a great way to clean the area after a bowel movement and many patients find it is a way to soothe the area.  9. ACTIVITIES as tolerated:   a. You may resume regular (light) daily activities beginning the next day--such as daily self-care, walking, climbing stairs--gradually increasing activities as tolerated.  If you can walk 30 minutes without difficulty, it is safe to try more intense activity such as jogging, treadmill, bicycling, low-impact aerobics, etc. b. Refrain from any heavy lifting or straining for the first 2 weeks after your procedure, particularly if your surgery was for hemorrhoids. c. Avoid activities that make your pain worse d. You may drive when you are no longer taking prescription pain medication, you can comfortably wear a seatbelt, and you can safely maneuver your car and apply brakes.  10. FOLLOW UP in our office a. Please call [facility] at [phone] to set up an appointment to see your surgeon in the office for  a follow-up appointment approximately 2 weeks after your surgery. b. Make sure that you call for this appointment the day you arrive home to insure a convenient appointment time.  9. If you have disability or family leave forms that need to be completed, you may have them completed by your primary care physician's office; for return to work instructions, please ask our office staff and they will be happy to assist  you in obtaining this documentation   When to call us [phone]: 1. Poor pain control 2. Reactions / problems with new medications (rash/itching, etc)  3. Fever over 101.5 F (38.5 C) 4. Inability to urinate 5. Nausea/vomiting 6. Worsening swelling or bruising 7. Continued bleeding from incision. 8. Increased pain, redness, or drainage from the incision  The clinic staff is available to answer your questions during regular business hours (8:30am-5pm).  Please don't hesitate to call and ask to speak to one of our nurses for clinical concerns.   A surgeon from [facility] is always on call at the hospitals   If you have a medical emergency, go to the nearest emergency room or call 911.   [facility] [address], [place], [place]  [address] ? MAIN: [phone] FAX [phone] [email or web address]   General Anesthesia, Adult, Care After This sheet gives you information about how to care for yourself after your procedure. Your health care provider may also give you more specific instructions. If you have problems or questions, contact your health care provider. What can I expect after the procedure? After the procedure, the following side effects are common:  Pain or discomfort at the IV site.  Nausea.  Vomiting.  Sore throat.  Trouble concentrating.  Feeling cold or chills.  Weak or tired.  Sleepiness and fatigue.  Soreness and body aches. These side effects can affect parts of the body that were not involved in surgery. Follow these instructions at home:  For at least 24 hours after the procedure:  Have a responsible adult stay with you. It is important to have someone help care for you until you are awake and alert.  Rest as needed.  Do not: ? Participate in activities in which you could fall or become injured. ? Drive. ? Use heavy machinery. ? Drink alcohol. ? Take sleeping pills or medicines  that cause drowsiness. ? Make important decisions or sign legal documents. ? Take care of children on your own. Eating and drinking  Follow any instructions from your health care provider about eating or drinking restrictions.  When you feel hungry, start by eating small amounts of foods that are soft and easy to digest (bland), such as toast. Gradually return to your regular diet.  Drink enough fluid to keep your urine pale yellow.  If you vomit, rehydrate by drinking water, juice, or clear broth. General instructions  If you have sleep apnea, surgery and certain medicines can increase your risk for breathing problems. Follow instructions from your health care provider about wearing your sleep device: ? Anytime you are sleeping, including during daytime naps. ? While taking prescription pain medicines, sleeping medicines, or medicines that make you drowsy.  Return to your normal activities as told by your health care provider. Ask your health care provider what activities are safe for you.  Take over-the-counter and prescription medicines only as told by your health care provider.  If you smoke, do not smoke without supervision.  Keep all follow-up visits as told by  your health care provider. This is important. Contact a health care provider if:  You have nausea or vomiting that does not get better with medicine.  You cannot eat or drink without vomiting.  You have pain that does not get better with medicine.  You are unable to pass urine.  You develop a skin rash.  You have a fever.  You have redness around your IV site that gets worse. Get help right away if:  You have difficulty breathing.  You have chest pain.  You have blood in your urine or stool, or you vomit blood. Summary  After the procedure, it is common to have a sore throat or nausea. It is also common to feel tired.  Have a responsible adult stay with you for the first 24 hours after general  anesthesia. It is important to have someone help care for you until you are awake and alert.  When you feel hungry, start by eating small amounts of foods that are soft and easy to digest (bland), such as toast. Gradually return to your regular diet.  Drink enough fluid to keep your urine pale yellow.  Return to your normal activities as told by your health care provider. Ask your health care provider what activities are safe for you. This information is not intended to replace advice given to you by your health care provider. Make sure you discuss any questions you have with your health care provider. Document Revised: 02/26/2017 Document Reviewed: 10/09/2016 Elsevier Patient Education  2020 [employer].

## 2019-10-18 NOTE — Transfer of Care (Signed)
Immediate Anesthesia Transfer of Care Note  Patient: Rebecca Romero  Procedure(s) Performed: ANORECTAL EXAM UNDER ANESTHESIA (N/A ) draining SETON placement , partial FISTULOTOMY (N/A )  Patient Location: PACU  Anesthesia Type:General  Level of Consciousness: awake and alert   Airway & Oxygen Therapy: Patient Spontanous Breathing and Patient connected to face mask oxygen  Post-op Assessment: Report given to RN and Post -op Vital signs reviewed and stable  Post vital signs: Reviewed and stable  Last Vitals:  Vitals Value Taken Time  BP 138/90 10/18/19 1311  Temp    Pulse 94 10/18/19 1313  Resp 18 10/18/19 1313  SpO2 100 % 10/18/19 1313  Vitals shown include unvalidated device data.  Last Pain:  Vitals:   10/18/19 1135  TempSrc:   PainSc: 0-No pain         Complications: No complications documented.

## 2019-10-18 NOTE — Anesthesia Preprocedure Evaluation (Signed)
Anesthesia Evaluation  Patient identified by MRN, date of birth, ID band Patient awake    Reviewed: Allergy & Precautions, NPO status , Patient's Chart, lab work & pertinent test results  Airway Mallampati: II  TM Distance: >3 FB Neck ROM: Full    Dental no notable dental hx.    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   GI/Hepatic negative GI ROS, Neg liver ROS,   Endo/Other  negative endocrine ROS  Renal/GU negative Renal ROS  negative genitourinary   Musculoskeletal negative musculoskeletal ROS (+)   Abdominal   Peds negative pediatric ROS (+)  Hematology negative hematology ROS (+)   Anesthesia Other Findings   Reproductive/Obstetrics negative OB ROS                             Anesthesia Physical Anesthesia Plan  ASA: II  Anesthesia Plan: General   Post-op Pain Management:    Induction: Intravenous  PONV Risk Score and Plan: 3 and Ondansetron, Dexamethasone, Midazolam and Treatment may vary due to age or medical condition  Airway Management Planned: Oral ETT  Additional Equipment:   Intra-op Plan:   Post-operative Plan: Extubation in OR  Informed Consent: I have reviewed the patients History and Physical, chart, labs and discussed the procedure including the risks, benefits and alternatives for the proposed anesthesia with the patient or authorized representative who has indicated his/her understanding and acceptance.     Dental advisory given  Plan Discussed with: CRNA and Surgeon  Anesthesia Plan Comments:         Anesthesia Quick Evaluation

## 2019-10-18 NOTE — Anesthesia Postprocedure Evaluation (Deleted)
Anesthesia Post Note  Patient: Rebecca Romero  Procedure(s) Performed: ANORECTAL EXAM UNDER ANESTHESIA (N/A ) draining SETON placement , partial FISTULOTOMY (N/A )     Patient location during evaluation: PACU Anesthesia Type: General Level of consciousness: awake and alert Pain management: pain level controlled Vital Signs Assessment: post-procedure vital signs reviewed and stable Respiratory status: spontaneous breathing, nonlabored ventilation, respiratory function stable and patient connected to nasal cannula oxygen Cardiovascular status: blood pressure returned to baseline and stable Postop Assessment: no apparent nausea or vomiting Anesthetic complications: no   No complications documented.  Last Vitals:  Vitals:   10/18/19 1330 10/18/19 1345  BP: (!) 148/83 (!) 146/85  Pulse: 88 75  Resp: 12 16  Temp: 36.4 C 36.5 C  SpO2: 92% 92%    Last Pain:  Vitals:   10/18/19 1345  TempSrc:   PainSc: 3                  Melicia Esqueda S

## 2019-10-18 NOTE — Anesthesia Procedure Notes (Signed)
Procedure Name: Intubation Date/Time: 10/18/2019 12:05 PM Performed by: Mitzie Na, CRNA Pre-anesthesia Checklist: Patient identified, Emergency Drugs available, Suction available and Patient being monitored Patient Re-evaluated:Patient Re-evaluated prior to induction Oxygen Delivery Method: Circle system utilized Preoxygenation: Pre-oxygenation with 100% oxygen Induction Type: IV induction Ventilation: Mask ventilation without difficulty and Oral airway inserted - appropriate to patient size Laryngoscope Size: Mac and 4 Grade View: Grade I Tube type: Oral Tube size: 7.5 mm Number of attempts: 1 Airway Equipment and Method: Stylet and Oral airway Placement Confirmation: ETT inserted through vocal cords under direct vision,  positive ETCO2 and breath sounds checked- equal and bilateral Secured at: 23 cm Tube secured with: Tape Dental Injury: Teeth and Oropharynx as per pre-operative assessment

## 2019-10-19 ENCOUNTER — Encounter (HOSPITAL_COMMUNITY): Payer: Self-pay | Admitting: Surgery

## 2019-10-21 ENCOUNTER — Telehealth: Payer: Self-pay | Admitting: Surgery
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
## 2019-11-27 ENCOUNTER — Ambulatory Visit: Payer: Self-pay | Admitting: Surgery
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
## 2019-12-01 ENCOUNTER — Other Ambulatory Visit: Payer: Self-pay | Admitting: Student

## 2019-12-01 DIAGNOSIS — Z1231 Encounter for screening mammogram for malignant neoplasm of breast: Secondary | ICD-10-CM

## 2019-12-01 DIAGNOSIS — Z780 Asymptomatic menopausal state: Secondary | ICD-10-CM

## 2019-12-05 ENCOUNTER — Other Ambulatory Visit: Payer: Self-pay | Admitting: Student

## 2019-12-05 DIAGNOSIS — R609 Edema, unspecified: Secondary | ICD-10-CM

## 2019-12-05 DIAGNOSIS — I739 Peripheral vascular disease, unspecified: Secondary | ICD-10-CM

## 2019-12-05 DIAGNOSIS — R200 Anesthesia of skin: Secondary | ICD-10-CM

## 2019-12-08 ENCOUNTER — Ambulatory Visit
Admission: RE | Admit: 2019-12-08 | Discharge: 2019-12-08 | Disposition: A | Discharge status: Home / Self Care | Payer: Medicare HMO | Source: Ambulatory Visit | Attending: Student | Admitting: Student

## 2019-12-08 DIAGNOSIS — R200 Anesthesia of skin: Secondary | ICD-10-CM

## 2019-12-08 DIAGNOSIS — R609 Edema, unspecified: Secondary | ICD-10-CM

## 2019-12-11 ENCOUNTER — Ambulatory Visit
Admission: RE | Admit: 2019-12-11 | Discharge: 2019-12-11 | Disposition: A | Discharge status: Home / Self Care | Payer: Medicare HMO | Source: Ambulatory Visit | Attending: Student | Admitting: Student

## 2019-12-11 DIAGNOSIS — I739 Peripheral vascular disease, unspecified: Secondary | ICD-10-CM

## 2019-12-12 ENCOUNTER — Ambulatory Visit
Admission: RE | Admit: 2019-12-12 | Discharge: 2019-12-12 | Disposition: A | Discharge status: Home / Self Care | Payer: Medicare HMO | Source: Ambulatory Visit | Attending: Student | Admitting: Student

## 2019-12-12 DIAGNOSIS — R609 Edema, unspecified: Secondary | ICD-10-CM

## 2019-12-22 ENCOUNTER — Ambulatory Visit
Admission: RE | Admit: 2019-12-22 | Discharge: 2019-12-22 | Disposition: A | Discharge status: Home / Self Care | Payer: Medicare HMO | Source: Ambulatory Visit | Attending: Student | Admitting: Student

## 2019-12-22 ENCOUNTER — Other Ambulatory Visit: Payer: Self-pay

## 2019-12-22 DIAGNOSIS — Z1231 Encounter for screening mammogram for malignant neoplasm of breast: Secondary | ICD-10-CM

## 2020-01-11 NOTE — Patient Instructions (Addendum)
DUE TO COVID-19 ONLY ONE VISITOR IS ALLOWED TO COME WITH YOU AND STAY IN THE WAITING ROOM ONLY DURING PRE OP AND PROCEDURE DAY OF SURGERY. THE 1 VISITOR  MAY VISIT WITH YOU AFTER SURGERY IN YOUR PRIVATE ROOM DURING VISITING HOURS ONLY!  YOU NEED TO HAVE A COVID 19 TEST ON_11/11______ @_2 :50pm______, THIS TEST MUST BE DONE BEFORE SURGERY,  COVID TESTING SITE [address] [place] , IT IS ON THE RIGHT GOING OUT [address] APPROXIMATELY  2 MINUTES PAST [employer] ON THE RIGHT. ONCE YOUR COVID TEST IS COMPLETED,  PLEASE BEGIN THE QUARANTINE INSTRUCTIONS AS OUTLINED IN YOUR HANDOUT.                Rebecca Romero   Your procedure is scheduled on: 01/22/20   Report to [facility] Main  Entrance   Report to Short Stay at 5:30 AM     Call this number if you have problems the morning of surgery [phone]    Remember: Do not eat food or drink liquids :After Midnight.   BRUSH YOUR TEETH MORNING OF SURGERY AND RINSE YOUR MOUTH OUT, NO CHEWING GUM CANDY OR MINTS.     Take these medicines the morning of surgery with A SIP OF WATER: Levetiracetam, Cymbalta                                 You may not have any metal on your body including hair pins and              piercings  Do not wear jewelry, make-up, lotions, powders or perfumes, deodorant             Do not wear nail polish on your fingernails.  Do not shave  48 hours prior to surgery.                Do not bring valuables to the hospital. [facility] IS NOT             RESPONSIBLE   FOR VALUABLES.  Contacts, dentures or bridgework may not be worn into surgery.       Patients discharged the day of surgery will not be allowed to drive home  . IF YOU ARE HAVING SURGERY AND GOING HOME THE SAME DAY, YOU MUST HAVE AN ADULT TO DRIVE YOU HOME AND BE WITH YOU FOR 24 HOURS.   YOU MAY GO HOME BY TAXI OR UBER OR ORTHERWISE, BUT AN ADULT MUST ACCOMPANY YOU HOME AND STAY WITH YOU FOR 24  HOURS.  Name and phone number of your driver:  Special Instructions: N/A              Please read over the following fact sheets you were given: _____________________________________________________________________             [facility] - Preparing for Surgery Before surgery, you can play an important role.   Because skin is not sterile, your skin needs to be as free of germs as possible.   You can reduce the number of germs on your skin by washing with CHG (chlorahexidine gluconate) soap before surgery.   CHG is an antiseptic cleaner which kills germs and bonds with the skin to continue killing germs even after washing. Please DO NOT use if you have an allergy to CHG or antibacterial soaps.   If your skin becomes reddened/irritated stop using the CHG and inform  your nurse when you arrive at Short Stay. Do not shave (including legs and underarms) for at least 48 hours prior to the first CHG shower.   . Please follow these instructions carefully:  1.  Shower with CHG Soap the night before surgery and the  morning of Surgery.  2.  If you choose to wash your hair, wash your hair first as usual with your  normal  shampoo.  3.  After you shampoo, rinse your hair and body thoroughly to remove the  shampoo.                                       4 .  Use CHG as you would any other liquid soap.  You can apply chg directly  to the skin and wash                       Gently with a scrungie or clean washcloth.  5.  Apply the CHG Soap to your body ONLY FROM THE NECK DOWN.   Do not use on face/ open                           Wound or open sores. Avoid contact with eyes, ears mouth and genitals (private parts).                       Wash face,  Genitals (private parts) with your normal soap.             6.  Wash thoroughly, paying special attention to the area where your surgery  will be performed.  7.  Thoroughly rinse your body with warm water from the neck down.  8.  DO NOT shower/wash with your  normal soap after using and rinsing off  the CHG Soap.             9.  Pat yourself dry with a clean towel.            10.  Wear clean pajamas.            11.  Place clean sheets on your bed the night of your first shower and do not  sleep with pets. Day of Surgery : Do not apply any lotions/deodorants the morning of surgery.  Please wear clean clothes to the hospital/surgery center.  FAILURE TO FOLLOW THESE INSTRUCTIONS MAY RESULT IN THE CANCELLATION OF YOUR SURGERY PATIENT SIGNATURE_________________________________  NURSE SIGNATURE__________________________________  ________________________________________________________________________

## 2020-01-15 ENCOUNTER — Encounter (HOSPITAL_COMMUNITY): Payer: Self-pay

## 2020-01-15 ENCOUNTER — Encounter (HOSPITAL_COMMUNITY)
Admission: RE | Admit: 2020-01-15 | Discharge: 2020-01-15 | Disposition: A | Discharge status: Home / Self Care | Payer: Medicare HMO | Source: Ambulatory Visit | Attending: Surgery | Admitting: Surgery

## 2020-01-15 ENCOUNTER — Other Ambulatory Visit: Payer: Self-pay

## 2020-01-15 DIAGNOSIS — Z01812 Encounter for preprocedural laboratory examination: Secondary | ICD-10-CM | POA: Diagnosis present

## 2020-01-15 HISTORY — DX: Headache, unspecified: R51.9

## 2020-01-15 LAB — COMPREHENSIVE METABOLIC PANEL
ALT: 23 U/L (ref 0–44)
AST: 19 U/L (ref 15–41)
Albumin: 3.5 g/dL (ref 3.5–5.0)
Alkaline Phosphatase: 100 U/L (ref 38–126)
Anion gap: 6 (ref 5–15)
BUN: 15 mg/dL (ref 6–20)
CO2: 24 mmol/L (ref 22–32)
Calcium: 8.8 mg/dL — ABNORMAL LOW (ref 8.9–10.3)
Chloride: 111 mmol/L (ref 98–111)
Creatinine, Ser: 0.76 mg/dL (ref 0.44–1.00)
GFR, Estimated: >60 mL/min (ref >60)
Glucose, Bld: 107 mg/dL — ABNORMAL HIGH (ref 70–99)
Potassium: 4.4 mmol/L (ref 3.5–5.1)
Sodium: 141 mmol/L (ref 135–145)
Total Bilirubin: 0.6 mg/dL (ref 0.3–1.2)
Total Protein: 6.7 g/dL (ref 6.5–8.1)

## 2020-01-15 LAB — CBC WITH DIFFERENTIAL/PLATELET
Abs Immature Granulocytes: 0.04 10*3/uL (ref 0.00–0.07)
Basophils Absolute: 0.1 10*3/uL (ref 0.0–0.1)
Basophils Relative: 1 %
Eosinophils Absolute: 0.3 10*3/uL (ref 0.0–0.5)
Eosinophils Relative: 3 %
HCT: 45.9 % (ref 36.0–46.0)
Hemoglobin: 15.5 g/dL — ABNORMAL HIGH (ref 12.0–15.0)
Immature Granulocytes: 0 %
Lymphocytes Relative: 33 %
Lymphs Abs: 3 10*3/uL (ref 0.7–4.0)
MCH: 31.1 pg (ref 26.0–34.0)
MCHC: 33.8 g/dL (ref 30.0–36.0)
MCV: 92 fL (ref 80.0–100.0)
Monocytes Absolute: 0.6 10*3/uL (ref 0.1–1.0)
Monocytes Relative: 6 %
Neutro Abs: 5.1 10*3/uL (ref 1.7–7.7)
Neutrophils Relative %: 57 %
Platelets: 280 10*3/uL (ref 150–400)
RBC: 4.99 MIL/uL (ref 3.87–5.11)
RDW: 13 % (ref 11.5–15.5)
WBC: 9 10*3/uL (ref 4.0–10.5)
nRBC: 0 % (ref 0.0–0.2)
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
## 2020-01-18 ENCOUNTER — Other Ambulatory Visit (HOSPITAL_COMMUNITY): Payer: Medicare HMO

## 2020-01-18 ENCOUNTER — Other Ambulatory Visit (HOSPITAL_COMMUNITY)
Admission: RE | Admit: 2020-01-18 | Discharge: 2020-01-18 | Disposition: A | Discharge status: Home / Self Care | Payer: Medicare HMO | Source: Ambulatory Visit | Attending: Surgery | Admitting: Surgery

## 2020-01-18 DIAGNOSIS — Z01812 Encounter for preprocedural laboratory examination: Secondary | ICD-10-CM | POA: Insufficient documentation

## 2020-01-18 DIAGNOSIS — Z20822 Contact with and (suspected) exposure to covid-19: Secondary | ICD-10-CM | POA: Diagnosis not present

## 2020-01-19 LAB — SARS CORONAVIRUS 2 (TAT 6-24 HRS): SARS Coronavirus 2: NEGATIVE

## 2020-01-21 MED ORDER — BUPIVACAINE LIPOSOME 1.3 % IJ SUSP
20.0000 mL | INTRAMUSCULAR | Status: DC
  Filled 2020-01-21: qty 20

## 2020-01-21 NOTE — Anesthesia Preprocedure Evaluation (Addendum)
Anesthesia Evaluation  Patient identified by MRN, date of birth, ID band Patient awake    Reviewed: Allergy & Precautions, NPO status , Patient's Chart, lab work & pertinent test results  Airway Mallampati: II  TM Distance: >3 FB Neck ROM: Full    Dental  (+) Teeth Intact, Dental Advisory Given   Pulmonary neg pulmonary ROS, Current Smoker and Patient abstained from smoking.,    Pulmonary exam normal breath sounds clear to auscultation       Cardiovascular negative cardio ROS Normal cardiovascular exam Rhythm:Regular Rate:Normal     Neuro/Psych  Headaches, PSYCHIATRIC DISORDERS Anxiety    GI/Hepatic negative GI ROS, Neg liver ROS,   Endo/Other  Hypothyroidism Morbid obesity (BMI 46)  Renal/GU negative Renal ROS  negative genitourinary   Musculoskeletal negative musculoskeletal ROS (+)   Abdominal   Peds  Hematology negative hematology ROS (+)   Anesthesia Other Findings   Reproductive/Obstetrics                            Anesthesia Physical Anesthesia Plan  ASA: III  Anesthesia Plan: General   Post-op Pain Management:    Induction: Intravenous  PONV Risk Score and Plan: 2 and Midazolam, Dexamethasone and Ondansetron  Airway Management Planned: Oral ETT  Additional Equipment:   Intra-op Plan:   Post-operative Plan: Extubation in OR  Informed Consent: I have reviewed the patients History and Physical, chart, labs and discussed the procedure including the risks, benefits and alternatives for the proposed anesthesia with the patient or authorized representative who has indicated his/her understanding and acceptance.     Dental advisory given  Plan Discussed with: CRNA  Anesthesia Plan Comments:         Anesthesia Quick Evaluation

## 2020-01-22 ENCOUNTER — Ambulatory Visit (HOSPITAL_COMMUNITY): Payer: Medicare HMO | Admitting: Certified Registered Nurse Anesthetist

## 2020-01-22 ENCOUNTER — Ambulatory Visit (HOSPITAL_COMMUNITY)
Admission: RE | Admit: 2020-01-22 | Discharge: 2020-01-22 | Disposition: A | Discharge status: Home / Self Care | Payer: Medicare HMO | Attending: Surgery | Admitting: Surgery

## 2020-01-22 ENCOUNTER — Encounter (HOSPITAL_COMMUNITY)
Admission: RE | Disposition: A | Discharge status: Home / Self Care | Payer: Self-pay | Source: Home / Self Care | Attending: Surgery

## 2020-01-22 ENCOUNTER — Ambulatory Visit (HOSPITAL_COMMUNITY): Payer: Medicare HMO | Admitting: Physician Assistant

## 2020-01-22 ENCOUNTER — Encounter (HOSPITAL_COMMUNITY): Payer: Self-pay | Admitting: Surgery

## 2020-01-22 ENCOUNTER — Telehealth (HOSPITAL_COMMUNITY): Payer: Self-pay | Admitting: *Deleted

## 2020-01-22 DIAGNOSIS — F17210 Nicotine dependence, cigarettes, uncomplicated: Secondary | ICD-10-CM | POA: Diagnosis not present

## 2020-01-22 DIAGNOSIS — Z881 Allergy status to other antibiotic agents status: Secondary | ICD-10-CM | POA: Insufficient documentation

## 2020-01-22 DIAGNOSIS — Z888 Allergy status to other drugs, medicaments and biological substances status: Secondary | ICD-10-CM | POA: Diagnosis not present

## 2020-01-22 DIAGNOSIS — Z885 Allergy status to narcotic agent status: Secondary | ICD-10-CM | POA: Diagnosis not present

## 2020-01-22 DIAGNOSIS — Z882 Allergy status to sulfonamides status: Secondary | ICD-10-CM | POA: Diagnosis not present

## 2020-01-22 DIAGNOSIS — K603 Anal fistula: Secondary | ICD-10-CM | POA: Insufficient documentation

## 2020-01-22 DIAGNOSIS — Z880 Allergy status to penicillin: Secondary | ICD-10-CM | POA: Diagnosis not present

## 2020-01-22 HISTORY — PX: LIGATION OF INTERNAL FISTULA TRACT: SHX6551

## 2020-01-22 LAB — PREGNANCY, URINE: Preg Test, Ur: NEGATIVE

## 2020-01-22 SURGERY — LIGATION, INTERNAL FISTULA TRACT
Anesthesia: General | Site: Rectum

## 2020-01-22 MED ORDER — FLEET ENEMA 7-19 GM/118ML RE ENEM
1.0000 | ENEMA | Freq: Once | RECTAL | Status: DC
  Filled 2020-01-22: qty 1

## 2020-01-22 MED ORDER — FENTANYL CITRATE (PF) 100 MCG/2ML IJ SOLN
INTRAMUSCULAR | Status: AC
  Filled 2020-01-22: qty 2

## 2020-01-22 MED ORDER — MIDAZOLAM HCL 5 MG/5ML IJ SOLN
INTRAMUSCULAR | Status: DC | PRN
  Administered 2020-01-22: 2 mg via INTRAVENOUS

## 2020-01-22 MED ORDER — FENTANYL CITRATE (PF) 250 MCG/5ML IJ SOLN
INTRAMUSCULAR | Status: AC
  Filled 2020-01-22: qty 5

## 2020-01-22 MED ORDER — DIBUCAINE (PERIANAL) 1 % EX OINT
TOPICAL_OINTMENT | CUTANEOUS | Status: DC | PRN
  Administered 2020-01-22: 1 via RECTAL

## 2020-01-22 MED ORDER — DEXAMETHASONE SODIUM PHOSPHATE 10 MG/ML IJ SOLN
INTRAMUSCULAR | Status: AC
  Filled 2020-01-22: qty 1

## 2020-01-22 MED ORDER — BUPIVACAINE-EPINEPHRINE (PF) 0.5% -1:200000 IJ SOLN
INTRAMUSCULAR | Status: AC
  Filled 2020-01-22: qty 30

## 2020-01-22 MED ORDER — ACETAMINOPHEN 500 MG PO TABS
1000.0000 mg | ORAL_TABLET | ORAL | Status: AC
  Administered 2020-01-22: 1000 mg via ORAL
  Filled 2020-01-22: qty 2

## 2020-01-22 MED ORDER — ORAL CARE MOUTH RINSE
15.0000 mL | Freq: Once | OROMUCOSAL | Status: AC
  Administered 2020-01-22: 15 mL via OROMUCOSAL

## 2020-01-22 MED ORDER — ACETAMINOPHEN 500 MG PO TABS: 1000.0000 mg | ORAL_TABLET | Freq: Once | ORAL | Status: DC

## 2020-01-22 MED ORDER — FENTANYL CITRATE (PF) 100 MCG/2ML IJ SOLN
25.0000 ug | INTRAMUSCULAR | Status: DC | PRN
  Administered 2020-01-22 (×3): 50 ug via INTRAVENOUS

## 2020-01-22 MED ORDER — IBUPROFEN 200 MG PO TABS
ORAL_TABLET | ORAL | Status: AC
  Filled 2020-01-22: qty 3

## 2020-01-22 MED ORDER — DEXMEDETOMIDINE (PRECEDEX) IN NS 20 MCG/5ML (4 MCG/ML) IV SYRINGE
PREFILLED_SYRINGE | INTRAVENOUS | Status: DC | PRN
  Administered 2020-01-22: 8 ug via INTRAVENOUS

## 2020-01-22 MED ORDER — DIBUCAINE (PERIANAL) 1 % EX OINT
TOPICAL_OINTMENT | CUTANEOUS | Status: AC
  Filled 2020-01-22: qty 28

## 2020-01-22 MED ORDER — CHLORHEXIDINE GLUCONATE CLOTH 2 % EX PADS: 6.0000 | MEDICATED_PAD | Freq: Once | CUTANEOUS | Status: DC

## 2020-01-22 MED ORDER — PROPOFOL 10 MG/ML IV BOLUS
INTRAVENOUS | Status: DC | PRN
  Administered 2020-01-22: 200 mg via INTRAVENOUS

## 2020-01-22 MED ORDER — METHYLENE BLUE 0.5 % INJ SOLN
INTRAVENOUS | Status: AC
  Filled 2020-01-22: qty 10

## 2020-01-22 MED ORDER — 0.9 % SODIUM CHLORIDE (POUR BTL) OPTIME
TOPICAL | Status: DC | PRN
  Administered 2020-01-22: 1000 mL

## 2020-01-22 MED ORDER — MIDAZOLAM HCL 2 MG/2ML IJ SOLN
INTRAMUSCULAR | Status: AC
  Filled 2020-01-22: qty 2

## 2020-01-22 MED ORDER — ONDANSETRON HCL 4 MG/2ML IJ SOLN
INTRAMUSCULAR | Status: DC | PRN
  Administered 2020-01-22: 4 mg via INTRAVENOUS

## 2020-01-22 MED ORDER — DIPHENHYDRAMINE HCL 50 MG/ML IJ SOLN
INTRAMUSCULAR | Status: AC
  Filled 2020-01-22: qty 1

## 2020-01-22 MED ORDER — ROCURONIUM BROMIDE 10 MG/ML (PF) SYRINGE
PREFILLED_SYRINGE | INTRAVENOUS | Status: AC
  Filled 2020-01-22: qty 10

## 2020-01-22 MED ORDER — DIPHENHYDRAMINE HCL 50 MG/ML IJ SOLN
12.5000 mg | Freq: Once | INTRAMUSCULAR | Status: AC
  Administered 2020-01-22: 12.5 mg via INTRAVENOUS

## 2020-01-22 MED ORDER — ONDANSETRON HCL 4 MG/2ML IJ SOLN
INTRAMUSCULAR | Status: AC
  Filled 2020-01-22: qty 2

## 2020-01-22 MED ORDER — BUPIVACAINE-EPINEPHRINE 0.5% -1:200000 IJ SOLN
INTRAMUSCULAR | Status: DC | PRN
  Administered 2020-01-22: 30 mL

## 2020-01-22 MED ORDER — DEXAMETHASONE SODIUM PHOSPHATE 10 MG/ML IJ SOLN
INTRAMUSCULAR | Status: DC | PRN
  Administered 2020-01-22: 10 mg via INTRAVENOUS

## 2020-01-22 MED ORDER — LIDOCAINE 2% (20 MG/ML) 5 ML SYRINGE
INTRAMUSCULAR | Status: AC
  Filled 2020-01-22: qty 5

## 2020-01-22 MED ORDER — CIPROFLOXACIN IN D5W 400 MG/200ML IV SOLN
400.0000 mg | INTRAVENOUS | Status: AC
  Administered 2020-01-22: 400 mg via INTRAVENOUS
  Filled 2020-01-22: qty 200

## 2020-01-22 MED ORDER — LIDOCAINE 2% (20 MG/ML) 5 ML SYRINGE
INTRAMUSCULAR | Status: DC | PRN
  Administered 2020-01-22: 100 mg via INTRAVENOUS

## 2020-01-22 MED ORDER — PROPOFOL 10 MG/ML IV BOLUS
INTRAVENOUS | Status: AC
  Filled 2020-01-22: qty 40

## 2020-01-22 MED ORDER — SUGAMMADEX SODIUM 500 MG/5ML IV SOLN
INTRAVENOUS | Status: DC | PRN
  Administered 2020-01-22: 300 mg via INTRAVENOUS

## 2020-01-22 MED ORDER — LACTATED RINGERS IV SOLN: INTRAVENOUS | Status: DC

## 2020-01-22 MED ORDER — METRONIDAZOLE IN NACL 5-0.79 MG/ML-% IV SOLN
500.0000 mg | INTRAVENOUS | Status: AC
  Administered 2020-01-22: 500 mg via INTRAVENOUS
  Filled 2020-01-22: qty 100

## 2020-01-22 MED ORDER — DEXMEDETOMIDINE (PRECEDEX) IN NS 20 MCG/5ML (4 MCG/ML) IV SYRINGE
PREFILLED_SYRINGE | INTRAVENOUS | Status: AC
  Filled 2020-01-22: qty 5

## 2020-01-22 MED ORDER — CHLORHEXIDINE GLUCONATE 0.12 % MT SOLN: 15.0000 mL | Freq: Once | OROMUCOSAL | Status: AC

## 2020-01-22 MED ORDER — ROCURONIUM BROMIDE 10 MG/ML (PF) SYRINGE
PREFILLED_SYRINGE | INTRAVENOUS | Status: DC | PRN
  Administered 2020-01-22: 100 mg via INTRAVENOUS

## 2020-01-22 MED ORDER — IBUPROFEN 200 MG PO TABS
600.0000 mg | ORAL_TABLET | Freq: Once | ORAL | Status: AC
  Administered 2020-01-22: 600 mg via ORAL

## 2020-01-22 MED ORDER — BUPIVACAINE LIPOSOME 1.3 % IJ SUSP
INTRAMUSCULAR | Status: DC | PRN
  Administered 2020-01-22: 20 mL

## 2020-01-22 MED ORDER — FENTANYL CITRATE (PF) 250 MCG/5ML IJ SOLN
INTRAMUSCULAR | Status: DC | PRN
Start: 2020-01-22 — End: 2020-01-22
  Administered 2020-01-22 (×2): 50 ug via INTRAVENOUS
  Administered 2020-01-22: 100 ug via INTRAVENOUS
  Administered 2020-01-22: 50 ug via INTRAVENOUS

## 2020-01-22 SURGICAL SUPPLY — 42 items
BENZOIN TINCTURE PRP APPL 2/3 (GAUZE/BANDAGES/DRESSINGS) ×3 IMPLANT
BLADE SURG 15 STRL LF DISP TIS (BLADE) IMPLANT
BLADE SURG 15 STRL SS (BLADE)
BRIEF STRETCH FOR OB PAD LRG (UNDERPADS AND DIAPERS) ×3 IMPLANT
CNTNR URN SCR LID CUP LEK RST (MISCELLANEOUS) ×2 IMPLANT
CONT SPEC 4OZ STRL OR WHT (MISCELLANEOUS) ×1
COVER SURGICAL LIGHT HANDLE (MISCELLANEOUS) ×3 IMPLANT
COVER WAND RF STERILE (DRAPES) IMPLANT
DECANTER SPIKE VIAL GLASS SM (MISCELLANEOUS) ×3 IMPLANT
DRAPE LAPAROTOMY T 102X78X121 (DRAPES) ×3 IMPLANT
DRSG PAD ABDOMINAL 8X10 ST (GAUZE/BANDAGES/DRESSINGS) ×3 IMPLANT
ELECT NEEDLE TIP 2.8 STRL (NEEDLE) ×3 IMPLANT
ELECT REM PT RETURN 15FT ADLT (MISCELLANEOUS) ×3 IMPLANT
GAUZE 4X4 16PLY RFD (DISPOSABLE) ×3 IMPLANT
GAUZE SPONGE 4X4 12PLY STRL (GAUZE/BANDAGES/DRESSINGS) ×3 IMPLANT
GLOVE BIO SURGEON STRL SZ7.5 (GLOVE) ×3 IMPLANT
GLOVE INDICATOR 8.0 STRL GRN (GLOVE) ×3 IMPLANT
GOWN STRL REUS W/TWL XL LVL3 (GOWN DISPOSABLE) ×6 IMPLANT
KIT BASIN OR (CUSTOM PROCEDURE TRAY) ×3 IMPLANT
KIT TURNOVER KIT A (KITS) IMPLANT
LOOP VESSEL MAXI BLUE (MISCELLANEOUS) IMPLANT
NEEDLE HYPO 22GX1.5 SAFETY (NEEDLE) ×3 IMPLANT
PACK BASIC VI WITH GOWN DISP (CUSTOM PROCEDURE TRAY) ×3 IMPLANT
PENCIL SMOKE EVACUATOR (MISCELLANEOUS) IMPLANT
RETRACTOR RING URO 16.6X16.6 (MISCELLANEOUS) ×3 IMPLANT
RETRACTOR STAY HOOK 5MM (MISCELLANEOUS) ×3 IMPLANT
SHEARS HARMONIC 9CM CVD (BLADE) IMPLANT
SURGILUBE 2OZ TUBE FLIPTOP (MISCELLANEOUS) ×3 IMPLANT
SUT CHROMIC 2 0 SH (SUTURE) ×3 IMPLANT
SUT CHROMIC 3 0 SH 27 (SUTURE) IMPLANT
SUT MNCRL AB 4-0 PS2 18 (SUTURE) ×3 IMPLANT
SUT VIC AB 2-0 SH 27 (SUTURE)
SUT VIC AB 2-0 SH 27X BRD (SUTURE) IMPLANT
SUT VIC AB 2-0 UR6 27 (SUTURE) ×18 IMPLANT
SUT VIC AB 3-0 SH 27 (SUTURE) ×1
SUT VIC AB 3-0 SH 27X BRD (SUTURE) ×2 IMPLANT
SUT VICRYL 0 UR6 27IN ABS (SUTURE) ×12 IMPLANT
SYR 20ML LL LF (SYRINGE) ×3 IMPLANT
SYR 3ML LL SCALE MARK (SYRINGE) IMPLANT
SYR BULB EAR ULCER 3OZ GRN STR (SYRINGE) ×3 IMPLANT
TOWEL OR 17X26 10 PK STRL BLUE (TOWEL DISPOSABLE) ×3 IMPLANT
TOWEL OR NON WOVEN STRL DISP B (DISPOSABLE) ×3 IMPLANT

## 2020-01-22 NOTE — Anesthesia Procedure Notes (Signed)
Procedure Name: Intubation Date/Time: 01/22/2020 7:45 AM Performed by: West Pugh, CRNA Pre-anesthesia Checklist: Patient identified, Emergency Drugs available, Suction available, Patient being monitored and Timeout performed Patient Re-evaluated:Patient Re-evaluated prior to induction Oxygen Delivery Method: Circle system utilized Preoxygenation: Pre-oxygenation with 100% oxygen Induction Type: IV induction Ventilation: Mask ventilation without difficulty Laryngoscope Size: Mac and 3 Grade View: Grade I Tube type: Oral Tube size: 7.0 mm Number of attempts: 1 Airway Equipment and Method: Stylet Placement Confirmation: ETT inserted through vocal cords under direct vision,  positive ETCO2,  CO2 detector and breath sounds checked- equal and bilateral Secured at: 21 cm Tube secured with: Tape Dental Injury: Teeth and Oropharynx as per pre-operative assessment  Comments: AOI x 1 attempt
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
## 2020-01-22 NOTE — Op Note (Signed)
01/22/2020  9:08 AM  PATIENT:  Rebecca Romero  49 y.o. female  Patient Care Team: Pcp, No as PCP - General Quenesha Douglass, Stephanie Coup, MD as Consulting Physician (General Surgery) Crista Elliot, MD as Consulting Physician (Urology)  PRE-OPERATIVE DIAGNOSIS:  Trans-sphincteric anal fistula  POST-OPERATIVE DIAGNOSIS:  Same  PROCEDURE:   1. Ligation of the intersphincteric fistulous tract 2. Anorectal exam under anesthesia  SURGEON:  Surgeon(s): Andria Meuse, MD Fritzi Mandes, MD  ASSISTANT: Sophronia Simas, MD   ANESTHESIA:   local and general  SPECIMEN:  None  DISPOSITION OF SPECIMEN:  N/A  COUNTS:  Sponge, needle, and instrument counts were reported correct x2 at conclusion.  EBL: 10 mL  PLAN OF CARE: Discharge to home after PACU  PATIENT DISPOSITION:  PACU - hemodynamically stable.  OR FINDINGS: Posteriorly based trans-sphincteric fistula with indwelling blue vessel loop seton. Normal appearing anoderm. No evidence of abscess or other unaddressed fistula. Ligation of intersphincteric fistulous tract carried out.  DESCRIPTION: The patient was identified in the preoperative holding area and taken to the OR. SCDs were applied. She then underwent general endotracheal anesthesia without difficulty. She was then rolled onto the OR table in the prone jackknife position. Pressure points were then evaluated and padded. Benzoin was applied to the buttocks and they were gently taped apart.  She was then prepped and draped in usual sterile fashion.  A surgical timeout was performed indicating the correct patient, procedure, and positioning.  A perianal block was then created using a dilute mixture of 0.25% Marcaine with epinephrine and Exparel.  After ascertaining an appropriate level of anesthesia had been achieved, a well lubricated digital rectal exam was performed. This demonstrated no palpable abnormalities. Indwelling blue vessel loop in place, posteriorly.  A  Hill-Ferguson anoscope was into the anal canal and circumferential inspection demonstrated normal appearing anoderm.  The seton was exchanged for a metal fistula probe.  The intersphincteric groove was palpated.  A curvilinear incision was then created at this location overlying the fistula tract. A [place] retractor was placed. The fistula tract was carefully circumferentially dissected in the intersphincteric plane without dividing any sphincter muscle. The tract was then clamped and divided between hemostats. This was then ligated with 0 Vicryl sutures. The tract was then gently probed from both the external and internal openings and the fistula had been completely ligated. The internal side of this was then imbricated with a small amount of muscle to buttress the ligation. The wound was irrigated and hemostasis verified. The wound was closed in layers with 3-0 vicryl suture followed by 4-0 Monocryl subcuticular suture.  Sponge, needle, and instrument counts were reported correct x2.  A dressing consisting of 4 x 4's, ABD and mesh underwear was placed.  She was then rolled back onto a stretcher, awakened from anesthesia, extubated, and transferred to PACU in satisfactory condition.  DISPOSITION: PACU in satisfactory condition.

## 2020-01-22 NOTE — Transfer of Care (Signed)
Immediate Anesthesia Transfer of Care Note  Patient: Genesi Pienta  Procedure(s) Performed: LIGATION OF INTERSPHINCTERIC FISTULOUS TRACT AND EUA (N/A Rectum)  Patient Location: PACU  Anesthesia Type:General  Level of Consciousness: awake, drowsy and patient cooperative  Airway & Oxygen Therapy: Patient Spontanous Breathing and Patient connected to face mask oxygen  Post-op Assessment: Report given to RN and Post -op Vital signs reviewed and stable  Post vital signs: Reviewed and stable  Last Vitals:  Vitals Value Taken Time  BP 151/87 01/22/20 0924  Temp    Pulse 101 01/22/20 0927  Resp 15 01/22/20 0927  SpO2 96 % 01/22/20 0927  Vitals shown include unvalidated device data.  Last Pain:  Vitals:   01/22/20 0644  TempSrc:   PainSc: 0-No pain         Complications: No complications documented.

## 2020-01-22 NOTE — Anesthesia Postprocedure Evaluation (Signed)
Anesthesia Post Note  Patient: Rebecca Romero  Procedure(s) Performed: LIGATION OF INTERSPHINCTERIC FISTULOUS TRACT AND EUA (N/A Rectum)     Patient location during evaluation: PACU Anesthesia Type: General Level of consciousness: awake and alert Pain management: pain level controlled Vital Signs Assessment: post-procedure vital signs reviewed and stable Respiratory status: spontaneous breathing, nonlabored ventilation, respiratory function stable and patient connected to nasal cannula oxygen Cardiovascular status: blood pressure returned to baseline and stable Postop Assessment: no apparent nausea or vomiting Anesthetic complications: no   No complications documented.  Last Vitals:  Vitals:   01/22/20 1045 01/22/20 1115  BP: (!) 148/85 140/78  Pulse: 68 81  Resp: 12 14  Temp: 36.4 C   SpO2: 100% 98%    Last Pain:  Vitals:   01/22/20 1244  TempSrc:   PainSc: 4                  [name] L Nihal Marzella

## 2020-01-22 NOTE — Discharge Instructions (Signed)
ANORECTAL SURGERY: POST OP INSTRUCTIONS  1. DIET: Follow a light bland diet the first 24 hours after arrival home, such as soup, liquids, crackers, etc.  Be sure to include lots of fluids daily.  Avoid fast food or heavy meals as your are more likely to get nauseated.  Eat a low fat diet the next few days after surgery.   2. Some bleeding with bowel movements is expected for the first couple of days but this should stop in between bowel movements  3. Take your usually prescribed home medications unless otherwise directed.  4. PAIN CONTROL: a. It is helpful to take an over-the-counter pain medication regularly for the first few days/weeks.  Choose from the following that works best for you: i. Ibuprofen (Advil, etc) Three 200mg  tabs every 6 hours as needed. ii. Acetaminophen (Tylenol, etc) 500-650mg  every 6 hours as needed iii. NOTE: You may take both of these medications together - most patients find it most helpful when alternating between the two (i.e. Ibuprofen at 6am, tylenol at 9am, ibuprofen at 12pm ...) b. As you had disscussed - if you need anything stronger, the percocet you have at home may be taken; however; be mindful of the fact that it likely contains acetaminophen (tylenol) or ibuprofen - if this is the case, do not take within 6 hours of taking that medication.  Take your pain medication as prescribed.  i. If you are having problems/concerns with the prescription medicine, please call for further advice.  5. Avoid getting constipated.  Between the surgery and the pain medications, it is common to experience some constipation.  Increasing fluid intake (64oz of water per day) and taking a fiber supplement (such as Metamucil, Citrucel, FiberCon) 1-2 times a day regularly will usually help prevent this problem from occurring.  Take Miralax (over the counter) 1-2x/day while taking a narcotic pain medication. If no bowel movement after 48hours, you may additionally take a laxative like a  bottle of Milk of Magnesia which can be purchased over the counter. Avoid enemas if possible as these are often painful.   6. Watch out for diarrhea.  If you have many loose bowel movements, simplify your diet to bland foods.  Stop any stool softeners and decrease your fiber supplement. If this worsens or does not improve, please call us.  7. Wash / shower every day.  If you were discharged with a dressing, you may remove this the day after your surgery. You may shower normally, getting soap/water on your wound, particularly after bowel movements.  8. Soaking in a warm bath filled a couple inches ("Sitz bath") is a great way to clean the area after a bowel movement and many patients find it is a way to soothe the area.  9. ACTIVITIES as tolerated:   a. You may resume regular (light) daily activities beginning the next day--such as daily self-care, walking, climbing stairs--gradually increasing activities as tolerated.  If you can walk 30 minutes without difficulty, it is safe to try more intense activity such as jogging, treadmill, bicycling, low-impact aerobics, etc. b. Refrain from any heavy lifting or straining for the first 2 weeks after your procedure, particularly if your surgery was for hemorrhoids. c. Avoid activities that make your pain worse d. You may drive when you are no longer taking prescription pain medication, you can comfortably wear a seatbelt, and you can safely maneuver your car and apply brakes.  10. FOLLOW UP in our office a. Please call [facility] at [phone]  to set up an appointment to see your surgeon in the office for a follow-up appointment approximately 2 weeks after your surgery. b. Make sure that you call for this appointment the day you arrive home to insure a convenient appointment time.  9. If you have disability or family leave forms that need to be completed, you may have them completed by your primary care physician's office; for return to work instructions,  please ask our office staff and they will be happy to assist you in obtaining this documentation   When to call us [phone]: 1. Poor pain control 2. Reactions / problems with new medications (rash/itching, etc)  3. Fever over 101.5 F (38.5 C) 4. Inability to urinate 5. Nausea/vomiting 6. Worsening swelling or bruising 7. Continued bleeding from incision. 8. Increased pain, redness, or drainage from the incision  The clinic staff is available to answer your questions during regular business hours (8:30am-5pm).  Please don't hesitate to call and ask to speak to one of our nurses for clinical concerns.   A surgeon from [facility] is always on call at the hospitals   If you have a medical emergency, go to the nearest emergency room or call 911.   [facility] [address], [place], [place]  [address] ? MAIN: [phone] FAX [phone] [email or web address]

## 2020-01-23 ENCOUNTER — Encounter (HOSPITAL_COMMUNITY): Payer: Self-pay | Admitting: Surgery

## 2020-03-20 ENCOUNTER — Other Ambulatory Visit: Payer: Medicare HMO

## 2020-06-12 ENCOUNTER — Other Ambulatory Visit: Payer: Medicare HMO

## 2020-06-13 ENCOUNTER — Other Ambulatory Visit: Payer: Medicare HMO

## 2020-10-14 ENCOUNTER — Ambulatory Visit: Admit: 2020-10-14 | Discharge: 2020-10-20 | Payer: MEDICARE | Primary: Sports Medicine

## 2020-10-14 ENCOUNTER — Ambulatory Visit: Admit: 2020-10-14 | Discharge: 2020-10-14 | Payer: MEDICARE | Attending: Orthopaedic Surgery | Primary: Sports Medicine

## 2020-10-14 DIAGNOSIS — M25562 Pain in left knee: Secondary | ICD-10-CM

## 2020-10-14 NOTE — Progress Notes (Signed)
Name: Cindy Klein  Date of Birth: 19-Oct-1970  Gender: female  MRN: [id number]      CC: Knee Pain (L knee)       HPI: Telecia Larocque is a 50 y.o. female who presents with Knee Pain (L knee)  Ms. Gardiner is a new patient who presents today with left knee and lower leg pain and swelling.  This began 6-7 years ago in [place] when she fell.  Since then she has been dealing with swelling in her lower leg as well as neurological issues like numbness and tingling from her thigh down to her shin.  She reports begin seen in [place] for these issues but has not met any provider that could identify what was causing her symptoms.  She saw Dr. Jonathon Resides on 08/06/20 and received an injection into her knee which did resolve her shin pain for two weeks.  She reports that she was unhappy with her care there and says her lower leg pain and swelling was never evaluated.  She reports intermittent swelling that comes and goes of the pretibial region that sometimes goes down her leg.        ROS/Meds/PSH/PMH/FH/SH: I personally reviewed the patients standard intake form.  Below are the pertinents    Tobacco:  reports that she has been smoking cigarettes. She has been smoking an average of .5 packs per day. She has never used smokeless tobacco.  Diabetes: none  Other: Peripheral vascular disease, history of traumatic brain injury    Physical Examination:  BMI 46.35  General: no acute distress  Lungs: breathing easily  CV: regular rhythm by pulse  Left Knee: Diffuse tenderness medial and lateral joint line.  She has some boggy soft tissue and fluctuant area in the pretibial region although swelling is difficult to assess compared to the contralateral side due to body habitus.      Imaging:   Knee XR: 4 views     Clinical Indication  1. Left knee pain, unspecified chronicity           Report: AP, lateral, PA flexion, sunrise views of the Left knee demonstrates no acute fracture dislocation, or advanced degenerative  changes    Impression: No acute findings as above            Left tib/fib XR: AP, Lateral views     Clinical Indication    ICD-10-CM    1. Left knee pain, unspecified chronicity  M25.562 XR TIBIA FIBULA LEFT (2 VIEWS)     XR KNEE LEFT (MIN 4 VIEWS)     MRI KNEE LEFT WO CONTRAST     MRI TIBIA FIBULA LEFT WO CONTRAST             Report: AP, lateral,  x-ray of the left tibia demonstrates no acute findings fracture or dislocation.  Large soft tissue shadow.    Impression: No acute findings as above     Christiana Pellant, MD      All imaging interpreted by myself Makaveli Hoard T. Shon Baton, MD independent of radiology review        Assessment:     ICD-10-CM    1. Left knee pain, unspecified chronicity  M25.562 XR TIBIA FIBULA LEFT (2 VIEWS)     XR KNEE LEFT (MIN 4 VIEWS)     MRI KNEE LEFT WO CONTRAST     MRI TIBIA FIBULA LEFT WO CONTRAST          Plan:   Concern for Morel lobulated type  lesion of the pretibial region of the left lower extremity.  We will evaluate this given her failure to improve with an MRI scan that extends down to the mid tibial region.  We will see her back to review the results and discuss any potential treatment options.              Mischell Branford T. Shon Baton MD, [facility]

## 2020-10-21 ENCOUNTER — Ambulatory Visit: Admit: 2020-10-21 | Discharge: 2020-10-21 | Payer: MEDICARE | Primary: Sports Medicine

## 2020-10-21 ENCOUNTER — Encounter: Payer: MEDICARE | Primary: Sports Medicine

## 2020-10-21 DIAGNOSIS — M25562 Pain in left knee: Secondary | ICD-10-CM

## 2020-10-28 ENCOUNTER — Ambulatory Visit: Admit: 2020-10-28 | Discharge: 2020-10-28 | Payer: MEDICARE | Attending: Orthopaedic Surgery | Primary: Sports Medicine

## 2020-10-28 DIAGNOSIS — R200 Anesthesia of skin: Secondary | ICD-10-CM

## 2020-10-28 NOTE — Progress Notes (Signed)
Name: Cindy Klein  Date of Birth: [birth year]  Gender: female  MRN: [id number]      CC: Follow-up (Left knee MRI results)       HPI: Cindy Klein is a 50 y.o. female who returns for follow up and MRI results on left knee, tibia, and fibula pain. She reports continued pain with no improvement in her symptoms.  She continues to report numbness and hypersensitivity of the anterior shin as well as occasionally up through her thigh with a "cold" feeling and numbness.Marland Kitchen        Physical Examination:  General: no acute distress  Lungs: breathing easily  CV: regular rhythm by pulse  Left Knee: Hypersensitivity in the pretibial region.  No knee effusion.  Full passive and active range of motion.    Imaging:   I reviewed the MRI scan which shows no obvious internal arrangement of the knee no meniscal pathology no advanced degenerative changes.  There is some nondescript soft tissue edema in the pretibial region but no fluid collection.  All imaging interpreted by myself Seibert Keeter T. Claudette Laws, MD independent of radiology review    Assessment:     ICD-10-CM    1. Left leg numbness  R20.0       2. Left knee pain, unspecified chronicity  M25.562            Plan:   Unfortunately I do not have a direct explanation for her pain.  There is not a Morel lesion or obvious fluid collection.  She does report numbness that goes from down her thigh to her pretibial shin region.  She does report occasional back pain that was treated by chiropractor in the past.  Unfortunate I do not have a significant intervention to offer her other than potentially some topical compounding cream.  She has been unable to tolerate gabapentin in the past that she takes for PTSD.  I do think it is reasonable to have her spine evaluated for potential source of the symptoms.  I will refer her to our spine team.  I do not need to see her back for her knee unless her symptoms change.            Lillyona Polasek T. Claudette Laws MD, FAAOS  Orthopaedics and Sports Medicine

## 2020-11-29 ENCOUNTER — Encounter: Payer: MEDICARE | Attending: Surgical | Primary: Sports Medicine

## 2020-12-30 ENCOUNTER — Ambulatory Visit: Admit: 2020-12-30 | Payer: MEDICARE | Primary: Sports Medicine

## 2020-12-30 ENCOUNTER — Ambulatory Visit: Admit: 2020-12-30 | Discharge: 2020-12-30 | Payer: MEDICARE | Attending: Surgical | Primary: Sports Medicine

## 2020-12-30 DIAGNOSIS — M545 Low back pain, unspecified: Secondary | ICD-10-CM

## 2020-12-30 NOTE — Progress Notes (Signed)
Name: Feliciana Narayan  Date of Birth: Nov 26, 1970  Gender: female  MRN: [id number]    CC: Leg Pain (Left thigh numbness and shin pain)       HPI: This is a 50 y.o. year old female who fell onto her left knee with the knee in flexed position 6 to 7 years ago getting off of bus in [place].  She had a large contusion with ecchymosis to the left knee during that time.  She has had chronic pain left anterior knee and proximal tibia since that time.  She has seen multiple orthopedist to evaluate this.  Most recently Dr. Claudette Laws with our practice.  Previously Dr. Duncan Dull.  She has had left knee injection that provided about 2 weeks relief of her shin pain.  Ultimately Dr. Claudette Laws did order MRI scan of the knee.  There was no internal derangement of the knee and no advanced degenerative changes.  Some nondescript soft tissue edema in the pretibial region but no fluid collection.  She also describes a numbness tingling cool sensation of the left lateral anterior thigh that seem to begin after this injury as well.  She denies significant back pain occasionally has had some lower back pain she feels because of her weight and she seen a chiropractor in the past but she does not feel the pain radiates from her back into the buttocks and it seems to be all in the left anterior thigh but it seems to come up from the proximal tibia region.  It is more of a cool sensation and is not present all the time.      This patient  has not had lumbar surgery in the past.   Not take gabapentin due to an allergy.    AMB PAIN ASSESSMENT 12/30/2020   Location of Pain Leg   Location Modifiers Left   Severity of Pain 6   Duration of Pain A few minutes   Frequency of Pain Intermittent   Date Pain First Started 08/18/2012   Limiting Behavior No   Result of Injury No   Work-Related Injury No   Are there other pain locations you wish to document? No              ROS/Meds/PSH/PMH/FH/SH: I personally reviewed the patient's collected intake data.  Below are  the pertinents:    Allergies   Allergen Reactions    Hydroxyzine Hcl Shortness Of Breath    Other Hives     NICOTINE PATCHES AND GUM   NICOTINE PATCHES AND GUM       Cephalexin Hives    Gabapentin Other (See Comments)     Blurred vision  Blurred vision      Hydrocodone Itching    Morphine And Related Itching    Penicillins Other (See Comments)     Unknown, childhood allergy  Unknown, childhood allergy      Sulfa Antibiotics Hives    Adhesive Tape Rash     If left on long--- rash, blister         Current Outpatient Medications:     vitamin D3 (CHOLECALCIFEROL) 125 MCG (5000 UT) TABS tablet, 5,000 Units, Disp: , Rfl:     vitamin E 400 UNIT capsule, Take 400 Units by mouth in the morning., Disp: , Rfl:     levETIRAcetam (KEPPRA) 500 MG tablet, , Disp: , Rfl:     vitamin B-12 (CYANOCOBALAMIN) 100 MCG tablet, Take 50 mcg by mouth in the morning., Disp: , Rfl:  Ashwagandha 125 MG CAPS, Take by mouth, Disp: , Rfl:     Levothyroxine Sodium 150 MCG/ML SOLN, Take by mouth, Disp: , Rfl:     Past Surgical History:   Procedure Laterality Date    CHOLECYSTECTOMY      KNEE ARTHROSCOPY Right        There is no problem list on file for this patient.        Tobacco:  reports that she has been smoking cigarettes. She has been smoking an average of .5 packs per day. She has never used smokeless tobacco.  Alcohol:   Social History     Substance and Sexual Activity   Alcohol Use None        Physical Exam:   BMI: There is no height or weight on file to calculate BMI.    GENERAL:  Adult in no acute distress, severely obese Patient is appropriately conversant  MSK:  Examination of the lumbar spine reveals no sagittal or coronal imbalance   There is no tenderness to palpation along the spinous processes and paraspinal musculature.   The patient ambulates with a waddle gait.    ROM of bilateral hip(s) reveals no irritability.  Negative Tinel sign over the lateral cutaneous femoral nerve    Left knee is very sensitive to touch bilaterally  and the left lateral proximal tibia not necessarily at the joint.  Range of motion of the joint is normal.  NEURO:  Cranial nerves grossly intact.  No motor deficits.    Straight leg testing is negative bilateral  Sensory testing reveals intact sensation to light touch and in the distribution of the L3-S1 dermatomes bilaterally  Ankle jerk is negative for clonus    Reflexes   Right Left   Quadriceps (L4) 2 2   Achilles (S1) 2 2     Strength testing in the lower extremity reveals the following based on the 5 point grading scale:     HF (L2) H Ab (L5) KE (L3/4) ADF (L4) EHL (L5) A Ev (S1) APF (S1)   Right 5 5 5 5 5 5 5    Left 5 5 5 5 5 5 5      PSYCH:  Alert and oriented X 3.  Appropriate affect.  Intact judgment and insight.         Radiographic Studies:     AP, lateral and spot views of the lumbar spine:  12/30/20  AP of the lumbar spine is normal alignment.  Pelvis is level.  Hips show no significant DJD.  Sagittal view of the lumbar spine shows adequate lordosis.  Mild degenerative changes and facet arthropathy lower lumbar spine mild degenerative disc L5-S1 but overall no acute abnormalities or significant abnormalities noted on x-ray.  X-ray impression: Mild degenerative changes lower lumbar spine no acute abnormality      Assessment/Plan:       Diagnosis Orders   1. Low back pain, unspecified back pain laterality, unspecified chronicity, unspecified whether sciatica present  XR LUMBAR SPINE (2-3 VIEWS)      2. DDD (degenerative disc disease), lumbar        3. Neuropathic pain of left lower extremity        4. Meralgia paresthetica of left side          Relief she has neuropathic pain from her contusion on her left lower extremity 7 years ago and this is some chronic neuropathic pain related to that.  I do think she has meralgia paresthetica of  the left thigh.  She does not have significant lower back pain, x-rays did not show significant abnormality.  My recommendation at this point would be referral to pain  management and physical therapy.  I think she needs desensitization the left lower extremity, weight loss.  Pain management for EMG nerve conduction study to help [place] better delineate diagnosis and treatment for neuropathic pain.  Ultimately if symptoms continue to persist, we can get MRI scan of the lumbar spine and I do not feel that is indicated until she has had some more directed conservative treatment investigation with EMG nerve conduction studies.      - Physical Therapy was prescribed and will include stretching, strengthening and modalities to promote blood flow, flexibility and core strengthening.  - Referral to pain management. The patient's care would be best managed in a formal pain management setting and will be referred accordingly.    Skull therapy for desensitization of the neuropathic pain.  Believe working with her hip to see if we can decrease any potential compression on lateral cutaneous nerve and specific desensitization in the proximal tibia.    4 This is a chronic illness/condition with exacerbation and progression    No orders of the defined types were placed in this encounter.       Orders Placed This Encounter   Procedures    XR LUMBAR SPINE (2-3 VIEWS)              Return for referral to pain management, EMG/NCS.     Wilmon Pali, PA-C  12/30/20      Elements of this note were created using speech recognition software.  As such, errors of speech recognition may be present.

## 2020-12-30 NOTE — Progress Notes (Signed)
An order for PT on the right hip has been entered. A referral to [facility] for eval and treat after ncs/emg was ordered.

## 2021-01-14 DIAGNOSIS — M545 Low back pain, unspecified: Secondary | ICD-10-CM

## 2021-01-14 NOTE — Progress Notes (Signed)
Nyliah Hank  DOB: [birth year]  Primary: [facility]  Secondary:  [facility]  [address]  [address]   [place] [address]  Phone: [phone]  Fax: [phone]    PT Visit Info:    No data recorded   OT Visit Info:  No data recorded    OUTPATIENT THERAPY:OP NOTE TYPE: Progress Report 01/14/2021               Episode  Appt Desk           Perel Verde cancelled her initial assessment for today due to  not wanting to miss voting day .  Will plan to follow up next during next appointment.  Thank you,  Melburn Popper, PT    Future Appointments   Date Time Provider Department Center   01/14/2021  7:00 PM Melburn Popper, PT [facility]   01/20/2021  1:00 PM Melburn Popper, PT [facility]

## 2021-01-15 ENCOUNTER — Inpatient Hospital Stay: Payer: MEDICARE | Primary: Sports Medicine

## 2021-01-20 ENCOUNTER — Inpatient Hospital Stay: Admit: 2021-01-20 | Payer: MEDICARE | Primary: Sports Medicine

## 2021-01-20 NOTE — Other (Signed)
Cindy Klein  DOB: May 08, 1970  Primary: [facility]  Secondary:  [facility]  [address]  [address]  [place] [address]  Phone: [phone]  Fax: [phone] Plan Frequency: 1-2x/wk for 6 weeks  Plan of Care/Certification Expiration Date: 03/21/21    PT Visit Info:    Total # of Visits to Date: 1    Visit Count:  1                OUTPATIENT PHYSICAL THERAPY:             OP NOTE TYPE: Initial Assessment 01/20/2021               Episode  Appt Desk         Treatment Diagnosis:  Pain in Left Knee (M25.562)  Low Back Pain (M54.5)  Medical/Referring Diagnosis:  Low back pain, unspecified back pain laterality, unspecified chronicity, unspecified whether sciatica present [M54.50]  DDD (degenerative disc disease), lumbar [M51.36]  Neuropathic pain of left lower extremity [M79.2]  Meralgia paresthetica of left side [G57.12]  Referring Physician:  Carloyn Manner  MD Orders:  PT Eval and Treat, LBP and hip pain, 2-3 times week for 4-5 weeks  Return MD Appt:  TBD  Date of Onset:  Onset Date:  (7-8 years ago)    Allergies:  Hydroxyzine hcl, Other, Cephalexin, Gabapentin, Hydrocodone, Morphine and related, Penicillins, Sulfa antibiotics, and Adhesive tape  Restrictions/Precautions:    Restrictions/Precautions: None      Medications Last Reviewed:  01/20/2021     SUBJECTIVE   History of Injury/Illness (Reason for Referral):  Pt fell getting off bus in [place] and landed on knee and has had pain, swelling, and numbness in L thigh and knee since then. Is going to get a nerve conduction study soon. Hurts all over except the top of her head.   Patient Stated Goal(s):  "walk w/o waddle, improved pain"  Initial:     3/10 Post Session:     3/10  Past Medical History/Comorbidities:   Cindy Klein  has a past medical history of Kidney stones, Peripheral artery disease (HCC), Peripheral vascular disease (HCC), Primary osteoarthritis of left knee, PTSD (post-traumatic stress disorder), and TBI (traumatic  brain injury).  Cindy Klein  has a past surgical history that includes Cholecystectomy and Knee arthroscopy (Right).  Social History/Living Environment:   Lives With: Alone  Type of Home: Apartment  Home Layout: One level  Home Access: Level entry     Prior Level of Function/Work/Activity:   Prior level of function: Independent  Occupation: Full time employment  Type of Occupation: Herbalist:   Does the patient/guardian have any barriers to learning?: No barriers  Will there be a co-learner?: No  What is the preferred language of the patient/guardian?: English  Is an interpreter required?: No  How does the patient/guardian prefer to learn new concepts?: Listening; Reading; Demonstration; Pictures/Videos     Fall Risk Scale:   Morse Total Score: 15  Morse Fall Risk: Low (0-24)           OBJECTIVE   Posture/Deformity: increased lumbar lordosis, thoracic kyphosis, forward head     Date:  01/20/21 Date:   Date:     Hip Flex R: 5/5  L: 4+/5     Hip Ext R: 5/5  L: 4-/5     Hip ABD R: 4+/5  L: 4-/5     Hip  ADD R: 4+/5  L: 4-/5     Hip ER R: 4/5  L: 4-/5     Hip IR R: 4/5  L: 4-/5     Knee Ext R: 5/5  L: 4-/5     Knee Flex R: 5/5  L: 5/5     Ankle DF R: 5/5  L: 5/5     Ankle PF R: 5/5  L: 5/5     Lumbar  WFL w/ all motions       Sensation:   Decreased L thigh and foot sensation    Special Test/Function:  Pelvic Obliquity: none   Accessory Mobility/CPAs: tender to lower lumbar spine ~L4-5  SI Compression: tender bilaterally  McKenzie: no increased strength w/ flexion or extension    ASSESSMENT   Initial Assessment:  Cindy Klein presents to PT eval w/ c/o L sided hip/knee soreness and numbness with occasional LBP. This is chronic in nature from a fall 6-7 years ago. At this time, she displays decreased core strength, balance, proximal hip strength, and mobility. Cindy Klein will benefit from continued skilled PT services to improve deficits listed below and to progress towards her PLOF.     Problem  List: (Impacting functional limitations):    Body Structures, Functions, Activity Limitations Requiring Skilled Therapeutic Intervention: Decreased functional mobility ; Decreased ADL status; Decreased ROM; Decreased body mechanics; Decreased strength; Decreased endurance; Decreased balance; Decreased sensation; Increased pain; Decreased posture     Therapy Prognosis:   Therapy Prognosis: Good     Initial Assessment Complexity: Decision Making: Low Complexity    PLAN   Effective Dates: 01/20/21 TO Plan of Care/Certification Expiration Date: 03/21/21   Frequency/Duration: Plan Frequency: 1-2x/wk for 6 weeks   Interventions Planned (Treatment may consist of any combination of the following):    Current Treatment Recommendations: Strengthening; ROM; Balance training; Functional mobility training; Transfer training; Endurance training; Investment banker, operational; Stair training; Manual Therapy - Soft Tissue Mobilization; Manual Therapy - Joint Manipulation; Home exercise program; Modalities; Integrated dry needling     Goals: (Goals have been discussed and agreed upon with patient.)  Short Term Goals: 5 weeks  1. Pt will be independent w/ HEP in order to improve outcomes and decrease pain.   2. Pt will report pain of 2/10 or less while performing ADLs in order to improve functional mobility.   3. Pt will have B hip flexion strength of 5/5 in order to increase mobility and balance.     Long Term Goals: 6 weeks  1. Pt will have Oswestry score of 11 or less in order to report decreased pain and decreased functional impairments.   2. Pt will have B hip abduction strength of 4+/5 or greater in order to improve mobility and balance.   3. Pt will have B hip extension strength of 4+/5 or greater in order to improve mobility and balance.            Outcome Measure:   Tool Used: Modified Oswestry Low Back Pain Questionnaire  Score:  Initial: 14/50  Most Recent: X/50 (Date: -- )   Interpretation of Score: Each section is scored on a 0-5 scale,  5 representing the greatest disability.  The scores of each section are added together for a total score of 50.      Medical Necessity:   > Patient is expected to demonstrate progress in strength, range of motion, balance, and coordination to increase independence with functional tasks.  Reason For Services/Other Comments:  > Patient  continues to demonstrate capacity to improve strength, ROM, balance, mobility which will increase independence.  Total Duration:  Time In: 1300  Time Out: 1350    Regarding Panhia Runyan's therapy, I certify that the treatment plan above will be carried out by a therapist or under their direction.  Thank you for this referral,  Melburn Popper, PT     Referring Physician Signature: Carloyn Manner                    Post Session Pain  Charge Capture  PT Visit Info MD Guidelines  MyChart  Access Code: [email or web address]  URL: [email or web address]  Date: 01/20/2021  Prepared by: Melburn Popper    Exercises  Supine Transversus Abdominis Bracing - Hands on Thighs - 1 x daily - 7 x weekly - 2 sets - 10 reps - 5 hold  Supine Lower Trunk Rotation - 1 x daily - 7 x weekly - 1 sets - 10 reps - 5 hold  Standing Hip Flexor Stretch - 1 x daily - 7 x weekly - 1 sets - 10 reps - 5 hold  Supine Bridge - 1 x daily - 3 x weekly - 2 sets - 10 reps - 5 hold  Clam with Resistance - 1 x daily - 3 x weekly - 2 sets - 10 reps - 5 hold

## 2021-01-20 NOTE — Progress Notes (Signed)
Cindy Klein  DOB: [birth year]  Primary: [facility]  Secondary:  [facility]  [address]  [address]  [place] [address]  Phone: [phone]  Fax: [phone] Plan Frequency: 1-2x/wk for 6 weeks  Plan of Care/Certification Expiration Date: 03/21/21    PT Visit Info:  Total # of Visits to Date: 1   Visit Count:  1   OUTPATIENT PHYSICAL THERAPY:OP NOTE TYPE: Treatment Note 01/20/2021       Episode  }Appt Desk             Treatment Diagnosis:  Pain in Left Knee (M25.562)  Low Back Pain (M54.5)  Medical/Referring Diagnosis:  Low back pain, unspecified back pain laterality, unspecified chronicity, unspecified whether sciatica present [M54.50]  DDD (degenerative disc disease), lumbar [M51.36]  Neuropathic pain of left lower extremity [M79.2]  Meralgia paresthetica of left side [G57.12]  Referring Physician:  Carloyn Manner  MD Orders:  PT Eval and Treat, LBP and hip pain, 2-3 times week for 4-5 weeks  Date of Onset:  Onset Date:  (7-8 years ago)   Allergies:   Hydroxyzine hcl, Other, Cephalexin, Gabapentin, Hydrocodone, Morphine and related, Penicillins, Sulfa antibiotics, and Adhesive tape  Restrictions/Precautions:  Restrictions/Precautions: None  No data recorded   Interventions Planned (Treatment may consist of any combination of the following):    Current Treatment Recommendations: Strengthening; ROM; Balance training; Functional mobility training; Transfer training; Endurance training; Investment banker, operational; Stair training; Manual Therapy - Soft Tissue Mobilization; Manual Therapy - Joint Manipulation; Home exercise program; Modalities; Integrated dry needling     Subjective Comments:  Pt reports chronic knee issues and numbness of thigh. Is getting a nerve conduction study soon. Notes MD thinks it is back or hip involvement as well.  Initial:}    3/10Post Session:       3/10  Medications Last Reviewed:  01/20/2021  Updated Objective Findings:  See evaluation note from today  Treatment    THERAPEUTIC EXERCISE: (10 minutes):    Exercises per grid below to improve mobility, strength, balance, and coordination.  Required minimal verbal and tactile cues to promote proper body alignment, promote proper body posture, and promote proper body mechanics.  Progressed resistance, range, repetitions, and complexity of movement as indicated.  MANUAL THERAPY: (0 minutes):   Joint mobilization and Soft tissue mobilization was utilized and necessary because of the patient's restricted joint motion, painful spasm, loss of articular motion, and restricted motion of soft tissue.   MODALITIES: (0 minutes):              Date:  01/20/21 Date:   Date:     Activity/Exercise Parameters Parameters Parameters   TA 10x     Bridge 10x     Clamshell 10x B lime band     LTR 10x     Hip flexor stretch 5x B standing at counter                     Treatment/Session Summary:    Treatment Assessment:  Cindy Klein was challenged w/ therex. L hip flexor got tight with some exercises so a stretch was added to her HEP. Overall her L hip was weaker with the R with tested and exercises.  Communication/Consultation:  None today  Equipment provided today:  HEP  Recommendations/Intent for next treatment session: Next visit will focus on progression of functional tasks as tolerated.    Total Treatment Billable Duration:  10 minutes therex  Time In: 1300  Time Out: 1350    Melburn Popper, PT       Charge Capture  }Post Session Pain  PT Visit Info  Med[facility] Portal  MD Guidelines  Scanned Media  Benefits  MyChart    Future Appointments   Date Time Provider Department Center   01/27/2021  3:15 PM Melburn Popper, PT [facility]   01/28/2021  1:45 PM Melburn Popper, PT [facility]   02/03/2021 10:30 AM Melburn Popper, PT [facility]   02/04/2021  1:00 PM Melburn Popper, PT [facility]   Access Code: [email or web address]  URL: [email or web address]  Date: 01/20/2021  Prepared by: Melburn Popper    Exercises  Supine Transversus  Abdominis Bracing - Hands on Thighs - 1 x daily - 7 x weekly - 2 sets - 10 reps - 5 hold  Supine Lower Trunk Rotation - 1 x daily - 7 x weekly - 1 sets - 10 reps - 5 hold  Standing Hip Flexor Stretch - 1 x daily - 7 x weekly - 1 sets - 10 reps - 5 hold  Supine Bridge - 1 x daily - 3 x weekly - 2 sets - 10 reps - 5 hold  Clam with Resistance - 1 x daily - 3 x weekly - 2 sets - 10 reps - 5 hold

## 2021-01-27 ENCOUNTER — Inpatient Hospital Stay: Admit: 2021-01-27 | Payer: MEDICARE | Primary: Sports Medicine

## 2021-01-27 NOTE — Progress Notes (Signed)
Cindy Klein  DOB: 09-13-1970  Primary: [facility]  Secondary:  [facility]  [address]  [address]  [place] [address]  Phone: [phone]  Fax: [phone] Plan Frequency: 1-2x/wk for 6 weeks  Plan of Care/Certification Expiration Date: 03/21/21      PT Visit Info:  Total # of Visits to Date: 2     Visit Count:  2   OUTPATIENT PHYSICAL THERAPY:OP NOTE TYPE: Treatment Note 01/27/2021       Episode  }Appt Desk             Treatment Diagnosis:  Pain in Left Knee (M25.562)  Low Back Pain (M54.5)  Medical/Referring Diagnosis:  Low back pain, unspecified back pain laterality, unspecified chronicity, unspecified whether sciatica present [M54.50]  DDD (degenerative disc disease), lumbar [M51.36]  Neuropathic pain of left lower extremity [M79.2]  Meralgia paresthetica of left side [G57.12]  Referring Physician:  Carloyn Manner  MD Orders:  PT Eval and Treat, LBP and hip pain, 2-3 times week for 4-5 weeks  Date of Onset:  Onset Date:  (7-8 years ago)     Allergies:   Hydroxyzine hcl, Other, Cephalexin, Gabapentin, Hydrocodone, Morphine and related, Penicillins, Sulfa antibiotics, and Adhesive tape  Restrictions/Precautions:  Restrictions/Precautions: None  No data recorded   Interventions Planned (Treatment may consist of any combination of the following):    Current Treatment Recommendations: Strengthening; ROM; Balance training; Functional mobility training; Transfer training; Endurance training; Investment banker, operational; Stair training; Manual Therapy - Soft Tissue Mobilization; Manual Therapy - Joint Manipulation; Home exercise program; Modalities; Integrated dry needling     Subjective Comments:  Pt reports she felt a little bit better after last session. Notes she lost her HEP and needs a new copy.  Initial:}    2/10Post Session:       1/10  Medications Last Reviewed:  01/27/2021  Updated Objective Findings:  None Today  Treatment   THERAPEUTIC EXERCISE: (40 minutes):    Exercises per grid below  to improve mobility, strength, balance, and coordination.  Required minimal verbal and tactile cues to promote proper body alignment, promote proper body posture, and promote proper body mechanics.  Progressed resistance, range, repetitions, and complexity of movement as indicated.  MANUAL THERAPY: (0 minutes):   Joint mobilization and Soft tissue mobilization was utilized and necessary because of the patient's restricted joint motion, painful spasm, loss of articular motion, and restricted motion of soft tissue.   MODALITIES: (0 minutes):              Date:  01/20/21 Date:  01/27/21 Date:     Activity/Exercise Parameters Parameters Parameters   NuStep  10 mins, 2.5    TA 10x 20x w/ ball    Bridge 10x 20x     SLR  2x10 B    Clamshell 10x B lime band 15x B lime band    LTR 10x     Piriformis stretch  5x B    Hip flexor stretch 5x B standing at counter     Step up  Forwards and sideways 10x     Mini squat  10x in //    Banded walk  59ftx 6 forward and sideways    3 way hip  10x B forwards, sideways, backwards        Treatment/Session Summary:    Treatment Assessment:  Pt was challenged w/ addition of hip strengthening in stand and SLR. Decrease in pain in LB and hips reported post  session. New HEP issued today.  Communication/Consultation:  None today  Equipment provided today:  HEP  Recommendations/Intent for next treatment session: Next visit will focus on progression of functional tasks as tolerated.    Total Treatment Billable Duration:  40 minutes therex  Time In: 1515  Time Out: 1600    Melburn Popper, PT       Charge Capture  }Post Session Pain  PT Visit Info  MedBridge Portal  MD Guidelines  Scanned Media  Benefits  MyChart    Future Appointments   Date Time Provider Department Center   01/28/2021  1:45 PM Melburn Popper, PT [facility]   02/03/2021 10:30 AM Melburn Popper, PT [facility]   02/04/2021  1:00 PM Melburn Popper, PT [facility]   Access Code: [email or web address]  URL:  [email or web address]  Date: 01/27/2021  Prepared by: Melburn Popper    Exercises  Supine Transversus Abdominis Bracing - Hands on Thighs - 1 x daily - 7 x weekly - 2 sets - 10 reps - 5 hold  Supine Lower Trunk Rotation - 1 x daily - 7 x weekly - 1 sets - 10 reps - 5 hold  Standing Hip Flexor Stretch - 1 x daily - 7 x weekly - 1 sets - 10 reps - 5 hold  Supine Bridge - 1 x daily - 3 x weekly - 2 sets - 10 reps - 5 hold  Active Straight Leg Raise with Quad Set - 1 x daily - 3 x weekly - 2 sets - 10 reps - 5 hold  Clam with Resistance - 1 x daily - 3 x weekly - 2 sets - 10 reps - 5 hold

## 2021-01-28 NOTE — Progress Notes (Signed)
Cindy Klein  DOB: 18-Oct-1970  Primary: [facility]  Secondary:  [facility]  [address]  [address]  [place] [address]  Phone: [phone]  Fax: [phone]    PT Visit Info:    Total # of Visits to Date: 2  Canceled Appointment: 1        OUTPATIENT THERAPY:OP NOTE TYPE: Progress Report 01/28/2021               Episode  Appt Desk           [name] cancelled her appointment for today due to unknown reasons.  Will plan to follow up next during next appointment.  Thank you,  Melburn Popper, PT    Future Appointments   Date Time Provider Department Center   01/28/2021  7:00 PM Melburn Popper, PT [facility]   02/03/2021  7:00 PM Melburn Popper, PT [facility]   02/04/2021  1:00 PM Melburn Popper, PT [facility]

## 2021-01-29 ENCOUNTER — Inpatient Hospital Stay: Payer: MEDICARE | Primary: Sports Medicine

## 2021-02-03 NOTE — Progress Notes (Signed)
Cindy Klein  DOB: 29-Oct-1970  Primary: [facility]  Secondary:  [facility]  [address]  [address]  [place] [address]  Phone: [phone]  Fax: [phone]    PT Visit Info:    Total # of Visits to Date: 2  Canceled Appointment: 2        OUTPATIENT THERAPY:OP NOTE TYPE: Progress Report 02/03/2021               Episode  Appt Desk           Cindy Klein cancelled her appointment for today due to unknown reasons.  Will plan to follow up next during next appointment.  Thank you,  Melburn Popper, PT    Future Appointments   Date Time Provider Department Center   02/03/2021  7:00 PM Melburn Popper, PT [facility]   02/04/2021  1:00 PM Melburn Popper, PT [facility]

## 2021-02-04 ENCOUNTER — Inpatient Hospital Stay: Payer: MEDICARE | Primary: Sports Medicine

## 2021-02-04 NOTE — Discharge Instructions (Signed)
Cindy Klein  DOB: [birth year]  Primary: [facility]  Secondary:  [facility]  [address]  [place] [address]  Phone: [phone]  Fax: [phone] Plan Frequency: 7-0Y/OV  Plan of Care/Certification Expiration Date: 05/05/21      PT Visit Info:    Total # of Visits to Date: 5  No Show: 1  Canceled Appointment: 3      Visit Count:  Visit count could not be calculated. Make sure you are using a visit which is associated with an episode.                OUTPATIENT PHYSICAL THERAPY:             OP NOTE TYPE: Discharge Summary 02/04/2021               Episode  Appt Desk         Treatment Diagnosis:  Pain in Left Knee (M25.562)  Low Back Pain (M54.5)  Medical/Referring Diagnosis:  Low back pain, unspecified back pain laterality, unspecified chronicity, unspecified whether sciatica present [M54.50]  DDD (degenerative disc disease), lumbar [M51.36]  Neuropathic pain of left lower extremity [M79.2]  Meralgia paresthetica of left side [G57.12]  Referring Physician:  Stark Bray  MD Orders:  PT Eval and Treat, LBP and hip pain, 2-3 times week for 4-5 weeks  Return MD Appt:  TBD  Date of Onset:  Onset Date:  (7-8 years ago)      Allergies:  Hydroxyzine hcl, Other, Cephalexin, Gabapentin, Hydrocodone, Morphine and related, Penicillins, Sulfa antibiotics, and Adhesive tape  Restrictions/Precautions:    Restrictions/Precautions: None      Medications Last Reviewed:  02/04/2021     SUBJECTIVE   History of Injury/Illness (Reason for Referral):  Pt fell getting off bus in [place] and landed on knee and has had pain, swelling, and numbness in L thigh and knee since then. Is going to get a nerve conduction study soon. Hurts all over except the top of her head.   Patient Stated Goal(s):  "walk w/o waddle, improved pain"  Initial:      /10 Post Session:      /10  Past Medical History/Comorbidities:   Cindy Klein  has a past medical history of Kidney stones, Peripheral artery disease  ([place]), Peripheral vascular disease ([place]), Primary osteoarthritis of left knee, PTSD (post-traumatic stress disorder), and TBI (traumatic brain injury).  Cindy Klein  has a past surgical history that includes Cholecystectomy and Knee arthroscopy (Right).         OBJECTIVE   Posture/Deformity: increased lumbar lordosis, thoracic kyphosis, forward head     Date:  01/20/21 Date:  03/04/21 Date:     Hip Flex R: 5/5  L: 4+/5 R: 5/5  L: 5/5    Hip Ext R: 5/5  L: 4-/5 R: 5/5  L: 4/5     Hip ABD R: 4+/5  L: 4-/5 R: 4+/5  L: 4+/5    Hip ADD R: 4+/5  L: 4-/5     Hip ER R: 4/5  L: 4-/5     Hip IR R: 4/5  L: 4-/5     Knee Ext R: 5/5  L: 4-/5     Knee Flex R: 5/5  L: 5/5     Ankle DF R: 5/5  L: 5/5     Ankle PF R: 5/5  L: 5/5     Lumbar  WFL w/ all motions  Sensation:   Decreased L thigh and foot sensation    Special Test/Function:  Pelvic Obliquity: none   Accessory Mobility/CPAs: tender to lower lumbar spine ~L4-5  SI Compression: tender bilaterally  [place]: no increased strength w/ flexion or extension    ASSESSMENT   Initial Assessment:  Cindy Klein presents to PT eval w/ c/o L sided hip/knee soreness and numbness with occasional LBP. This is chronic in nature from a fall 6-7 years ago. At this time, she displays decreased core strength, balance, proximal hip strength, and mobility. Cindy Klein will benefit from continued skilled PT services to improve deficits listed below and to progress towards her PLOF.   Updated Assessment: Cindy Klein presented to PT eval w/ L sided hip/knee pain and LBP. At the time of her last progress note, her pain had  improved and her strength had increased. Additionally, she had met 5/6 PT goals and was progressing towards her final goal. Due to her work schedule, she had to miss a few of her originally scheduled visits so the POC was extended in case she needed to return to PT. PT called patient on 04/08/21 to check in to see how she was doing and pt has been able to keep her  pain low with walking and exercises. DC from outpatient PT at this time. Thank you for this referral.     PLAN   Effective Dates: 01/20/21 TO Plan of Care/Certification Expiration Date: 05/05/21       Goals: (Goals have been discussed and agreed upon with patient.)  Short Term Goals: 5 weeks  1. Pt will be independent w/ HEP in order to improve outcomes and decrease pain. MET  2. Pt will report pain of 2/10 or less while performing ADLs in order to improve functional mobility. MET  3. Pt will have B hip flexion strength of 5/5 in order to increase mobility and balance. MET    Long Term Goals: 6 weeks  1. Pt will have Oswestry score of 11 or less in order to report decreased pain and decreased functional impairments. MET  2. Pt will have B hip abduction strength of 4+/5 or greater in order to improve mobility and balance. MET  3. Pt will have B hip extension strength of 4+/5 or greater in order to improve mobility and balance. Not met           Outcome Measure:   Tool Used: Modified Oswestry Low Back Pain Questionnaire  Score:  Initial: 14/50  Most Recent: 2/50 (Date: 03/04/21 )   Interpretation of Score: Each section is scored on a 0-5 scale, 5 representing the greatest disability.  The scores of each section are added together for a total score of 50.        Regarding [name]'s therapy, I certify that the treatment plan above will be carried out by a therapist or under their direction.  Thank you for this referral,  Willaim Rayas, PT     Referring Physician Signature: Hunt, Amy S, PA-C No Signature is Required for this note.        Post Session Pain  Charge Capture  PT Visit Info MD Guidelines  MyChart  Access Code: [email or web address]  URL: [email or web address]  Date: 03/04/2021  Prepared by: Willaim Rayas    Exercises  Supine Transversus Abdominis Bracing - Hands on Thighs - 1 x daily - 7 x weekly - 2 sets - 10 reps - 5 hold  Supine Lower Trunk Rotation - 1 x daily -  7 x weekly - 1 sets - 10  reps - 5 hold  Hooklying Single Knee to Chest Stretch - 1 x daily - 7 x weekly - 1 sets - 10 reps - 5 hold  Supine Piriformis Stretch with Foot on Ground - 1 x daily - 7 x weekly - 1 sets - 10 reps - 5 hold  Supine Butterfly Groin Stretch - 1 x daily - 7 x weekly - 1 sets - 10 reps - 5 hold  Standing Hip Flexor Stretch - 1 x daily - 7 x weekly - 1 sets - 10 reps - 5 hold  Supine Bridge - 1 x daily - 3 x weekly - 2 sets - 10 reps - 5 hold  Active Straight Leg Raise with Quad Set - 1 x daily - 3 x weekly - 2 sets - 10 reps - 5 hold  Clam with Resistance - 1 x daily - 3 x weekly - 2 sets - 10 reps - 5 hold

## 2021-02-04 NOTE — Progress Notes (Signed)
Leeanne Brege  DOB: 26-Jun-1970  Primary: [facility]  Secondary:  [facility]  [address]  [address]  [place] [address]  Phone: [phone]  Fax: [phone]    PT Visit Info:    Total # of Visits to Date: 2  No Show: 1  Canceled Appointment: 2        OUTPATIENT THERAPY:OP NOTE TYPE: Progress Report 02/04/2021               Episode  Appt Desk           Shameika Bebeau  came 25 minutes late to  her appointment for today due to  thinking the appointment was at 2:00 .  Will plan to follow up next during next appointment.  Thank you,  Melburn Popper, PT    Future Appointments   Date Time Provider Department Center   02/11/2021  1:00 PM Melburn Popper, PT [facility]   02/18/2021  1:00 PM Melburn Popper, PT [facility]   02/25/2021  1:00 PM Melburn Popper, PT [facility]   03/04/2021  1:00 PM Melburn Popper, PT [facility]

## 2021-02-11 ENCOUNTER — Inpatient Hospital Stay: Payer: MEDICARE | Primary: Sports Medicine

## 2021-02-11 DIAGNOSIS — M545 Low back pain, unspecified: Secondary | ICD-10-CM

## 2021-02-11 NOTE — Progress Notes (Signed)
Cindy Klein  DOB: [birth year]  [facility]: [facility]  Secondary:  [facility]  [address]  [address]  [place] [address]  Phone: [phone]  Fax: [phone]    PT Visit Info:    Total # of Visits to Date: 2  No Show: 1  Canceled Appointment: 3     OT Visit Info:  No data recorded    OUTPATIENT THERAPY:OP NOTE TYPE: Progress Report 02/11/2021               Episode  Appt Desk           Cindy Klein cancelled her appointment for today due to work related issues.  Will plan to follow up next during next appointment.  Thank you,  Melburn Popper, PT    Future Appointments   Date Time Provider Department Center   02/11/2021  1:00 PM Melburn Popper, PT [facility]   02/18/2021  1:00 PM Melburn Popper, PT [facility]   02/25/2021  1:00 PM Melburn Popper, PT [facility]   03/04/2021  1:00 PM Melburn Popper, PT [facility]

## 2021-02-18 ENCOUNTER — Inpatient Hospital Stay: Admit: 2021-02-18 | Payer: MEDICARE | Primary: Sports Medicine

## 2021-02-18 NOTE — Progress Notes (Signed)
Signa Nordquist  DOB: [birth year]  [facility]: [facility]  Secondary:  [facility]  [address]  [address]  [place] [address]  Phone: [phone]  Fax: [phone] Plan Frequency: 1-2x/wk for 6 weeks  Plan of Care/Certification Expiration Date: 03/21/21      PT Visit Info:  Total # of Visits to Date: 3  No Show: 1  Canceled Appointment: 3     Visit Count:  3   OUTPATIENT PHYSICAL THERAPY:OP NOTE TYPE: Treatment Note 02/18/2021       Episode  }Appt Desk             Treatment Diagnosis:  Pain in Left Knee (M25.562)  Low Back Pain (M54.5)  Medical/Referring Diagnosis:  Low back pain, unspecified back pain laterality, unspecified chronicity, unspecified whether sciatica present [M54.50]  DDD (degenerative disc disease), lumbar [M51.36]  Neuropathic pain of left lower extremity [M79.2]  Meralgia paresthetica of left side [G57.12]  Referring Physician:  Carloyn Manner  MD Orders:  PT Eval and Treat, LBP and hip pain, 2-3 times week for 4-5 weeks  Date of Onset:  Onset Date:  (7-8 years ago)     Allergies:   Hydroxyzine hcl, Other, Cephalexin, Gabapentin, Hydrocodone, Morphine and related, Penicillins, Sulfa antibiotics, and Adhesive tape  Restrictions/Precautions:  Restrictions/Precautions: None  No data recorded   Interventions Planned (Treatment may consist of any combination of the following):    Current Treatment Recommendations: Strengthening; ROM; Balance training; Functional mobility training; Transfer training; Endurance training; Investment banker, operational; Stair training; Manual Therapy - Soft Tissue Mobilization; Manual Therapy - Joint Manipulation; Home exercise program; Modalities; Integrated dry needling     Subjective Comments:  Pt reports pain is about the same as last time but on both sides.  Initial:}    2/10Post Session:       1/10  Medications Last Reviewed:  02/18/2021  Updated Objective Findings:  None Today  Treatment   THERAPEUTIC EXERCISE: (40 minutes):    Exercises per grid below to  improve mobility, strength, balance, and coordination.  Required minimal verbal and tactile cues to promote proper body alignment, promote proper body posture, and promote proper body mechanics.  Progressed resistance, range, repetitions, and complexity of movement as indicated.  MANUAL THERAPY: (0 minutes):   Joint mobilization and Soft tissue mobilization was utilized and necessary because of the patient's restricted joint motion, painful spasm, loss of articular motion, and restricted motion of soft tissue.   MODALITIES: (0 minutes):              Date:  01/20/21 Date:  01/27/21 Date:  02/18/21   Activity/Exercise Parameters Parameters Parameters   NuStep  10 mins, 2.5 10 mins, 3.0    TA 10x 20x w/ ball 20x w/ ball  10x SL    Bridge 10x 20x  20x    SLR  2x10 B    Clamshell 10x B lime band 15x B lime band 20x B lime band    LTR 10x     Piriformis stretch  5x B    Hip flexor stretch 5x B standing at counter     Step up  Forwards and sideways 10x  Forwards and sideways 10x B 6 inch step   Mini squat  10x in // 10x in //   Banded walk  84ftx 6 forward and sideways    3 way hip  10x B forwards, sideways, backwards 20x B forwards, sideways, backwards    Hamstring stretch  10x B seated       Treatment/Session Summary:    Treatment Assessment:  Pt fatigued w/ exercises and some were stopped early due to spinchter pain as she has a device that was tightened today. Continue w/ further strengthening as tolerated next session.  Communication/Consultation:  None today  Equipment provided today:  HEP  Recommendations/Intent for next treatment session: Next visit will focus on progression of functional tasks as tolerated.    Total Treatment Billable Duration:  40 minutes therex  Time In: 1300  Time Out: 1345    Melburn Popper, PT       Charge Capture  }Post Session Pain  PT Visit Info  MedBridge Portal  MD Guidelines  Scanned Media  Benefits  MyChart    Future Appointments   Date Time Provider Department Center   02/25/2021   1:00 PM Melburn Popper, PT [facility]   03/04/2021  1:00 PM Melburn Popper, PT [facility]   Access Code: [email or web address]  URL: [email or web address]  Date: 01/27/2021  Prepared by: Melburn Popper    Exercises  Supine Transversus Abdominis Bracing - Hands on Thighs - 1 x daily - 7 x weekly - 2 sets - 10 reps - 5 hold  Supine Lower Trunk Rotation - 1 x daily - 7 x weekly - 1 sets - 10 reps - 5 hold  Standing Hip Flexor Stretch - 1 x daily - 7 x weekly - 1 sets - 10 reps - 5 hold  Supine Bridge - 1 x daily - 3 x weekly - 2 sets - 10 reps - 5 hold  Active Straight Leg Raise with Quad Set - 1 x daily - 3 x weekly - 2 sets - 10 reps - 5 hold  Clam with Resistance - 1 x daily - 3 x weekly - 2 sets - 10 reps - 5 hold

## 2021-02-25 ENCOUNTER — Inpatient Hospital Stay: Admit: 2021-02-25 | Payer: MEDICARE | Primary: Sports Medicine

## 2021-02-25 NOTE — Progress Notes (Signed)
Cindy Klein  DOB: 01/22/1971  Primary: [facility]  Secondary:  [facility]  [address]  [address]  [place] [address]  Phone: [phone]  Fax: [phone] Plan Frequency: 1-2x/wk for 6 weeks  Plan of Care/Certification Expiration Date: 03/21/21      PT Visit Info:  Total # of Visits to Date: 4  No Show: 1  Canceled Appointment: 3     Visit Count:  4   OUTPATIENT PHYSICAL THERAPY:OP NOTE TYPE: Treatment Note 02/25/2021       Episode  }Appt Desk             Treatment Diagnosis:  Pain in Left Knee (M25.562)  Low Back Pain (M54.5)  Medical/Referring Diagnosis:  Low back pain, unspecified back pain laterality, unspecified chronicity, unspecified whether sciatica present [M54.50]  DDD (degenerative disc disease), lumbar [M51.36]  Neuropathic pain of left lower extremity [M79.2]  Meralgia paresthetica of left side [G57.12]  Referring Physician:  Carloyn Manner  MD Orders:  PT Eval and Treat, LBP and hip pain, 2-3 times week for 4-5 weeks  Date of Onset:  Onset Date:  (7-8 years ago)     Allergies:   Hydroxyzine hcl, Other, Cephalexin, Gabapentin, Hydrocodone, Morphine and related, Penicillins, Sulfa antibiotics, and Adhesive tape  Restrictions/Precautions:  Restrictions/Precautions: None  No data recorded   Interventions Planned (Treatment may consist of any combination of the following):    Current Treatment Recommendations: Strengthening; ROM; Balance training; Functional mobility training; Transfer training; Endurance training; Investment banker, operational; Stair training; Manual Therapy - Soft Tissue Mobilization; Manual Therapy - Joint Manipulation; Home exercise program; Modalities; Integrated dry needling     Subjective Comments:  Pt notes pain is about the same but foot is more sore. Pt had nerve block today. Told to take it easy.  Initial:}    3/10Post Session:       2/10  Medications Last Reviewed:  02/25/2021  Updated Objective Findings:  None Today  Treatment   THERAPEUTIC EXERCISE: (40  minutes):    Exercises per grid below to improve mobility, strength, balance, and coordination.  Required minimal verbal and tactile cues to promote proper body alignment, promote proper body posture, and promote proper body mechanics.  Progressed resistance, range, repetitions, and complexity of movement as indicated.  MANUAL THERAPY: (0 minutes):   Joint mobilization and Soft tissue mobilization was utilized and necessary because of the patient's restricted joint motion, painful spasm, loss of articular motion, and restricted motion of soft tissue.   MODALITIES: (0 minutes):              Date:  01/20/21 Date:  01/27/21 Date:  02/18/21 Date:  02/25/21   Activity/Exercise Parameters Parameters Parameters Parameters   NuStep  10 mins, 2.5 10 mins, 3.0  10 mins, 1.0    TA 10x 20x w/ ball 20x w/ ball  10x SL  20x w/ ball  10x SL   Bridge 10x 20x  20x  20x   SLR  2x10 B     Clamshell 10x B lime band 15x B lime band 20x B lime band     LTR 10x   10x    KTC    10x B   Piriformis stretch  5x B  10x B   Butterfly stretch    10x seated    Hip flexor stretch 5x B standing at counter   10x B   Step up  Forwards and sideways 10x  Forwards and sideways 10x  B 6 inch step    Mini squat  10x in // 10x in //    Banded walk  8ftx 6 forward and sideways     3 way hip  10x B forwards, sideways, backwards 20x B forwards, sideways, backwards     Hamstring stretch   10x B seated        Treatment/Session Summary:    Treatment Assessment:  Pt had less pain following stretches to LB and hips. Post session she still reported some soreness in hips/groin area bilaterally. No rhyme or reason to her pain per discussion with patient.  Communication/Consultation:  None today  Equipment provided today:  HEP  Recommendations/Intent for next treatment session: Next visit will focus on progression of functional tasks as tolerated.    Total Treatment Billable Duration:  40 minutes therex  Time In: 1300  Time Out: 1345    Melburn Popper, PT        Charge Capture  }Post Session Pain  PT Visit Info  MedBridge Portal  MD Guidelines  Scanned Media  Benefits  MyChart    Future Appointments   Date Time Provider Department Center   03/04/2021  1:00 PM Melburn Popper, PT [facility]   Access Code: [email or web address]  URL: [email or web address]  Date: 01/27/2021  Prepared by: Melburn Popper    Exercises  Supine Transversus Abdominis Bracing - Hands on Thighs - 1 x daily - 7 x weekly - 2 sets - 10 reps - 5 hold  Supine Lower Trunk Rotation - 1 x daily - 7 x weekly - 1 sets - 10 reps - 5 hold  Standing Hip Flexor Stretch - 1 x daily - 7 x weekly - 1 sets - 10 reps - 5 hold  Supine Bridge - 1 x daily - 3 x weekly - 2 sets - 10 reps - 5 hold  Active Straight Leg Raise with Quad Set - 1 x daily - 3 x weekly - 2 sets - 10 reps - 5 hold  Clam with Resistance - 1 x daily - 3 x weekly - 2 sets - 10 reps - 5 hold

## 2021-03-04 ENCOUNTER — Inpatient Hospital Stay: Admit: 2021-03-04 | Payer: MEDICARE | Primary: Sports Medicine

## 2021-03-04 NOTE — Progress Notes (Signed)
Cindy Klein  DOB: 1970/10/07  Primary: [facility]  Secondary:  [facility]  [address]  [address]  [place] [address]  Phone: [phone]  Fax: [phone] Plan Frequency: 1-2x/wk  Plan of Care/Certification Expiration Date: 05/05/21      PT Visit Info:  Total # of Visits to Date: 5  No Show: 1  Canceled Appointment: 3     Visit Count:  5   OUTPATIENT PHYSICAL THERAPY:OP NOTE TYPE: Treatment Note 03/04/2021       Episode  }Appt Desk             Treatment Diagnosis:  Pain in Left Knee (M25.562)  Low Back Pain (M54.5)  Medical/Referring Diagnosis:  Low back pain, unspecified back pain laterality, unspecified chronicity, unspecified whether sciatica present [M54.50]  DDD (degenerative disc disease), lumbar [M51.36]  Neuropathic pain of left lower extremity [M79.2]  Meralgia paresthetica of left side [G57.12]  Referring Physician:  Carloyn Manner  MD Orders:  PT Eval and Treat, LBP and hip pain, 2-3 times week for 4-5 weeks  Date of Onset:  Onset Date:  (7-8 years ago)     Allergies:   Hydroxyzine hcl, Other, Cephalexin, Gabapentin, Hydrocodone, Morphine and related, Penicillins, Sulfa antibiotics, and Adhesive tape  Restrictions/Precautions:  Restrictions/Precautions: None  No data recorded   Interventions Planned (Treatment may consist of any combination of the following):    Current Treatment Recommendations: Strengthening; ROM; Balance training; Functional mobility training; Transfer training; Endurance training; Investment banker, operational; Stair training; Manual Therapy - Soft Tissue Mobilization; Manual Therapy - Joint Manipulation; Home exercise program; Modalities; Integrated dry needling     Subjective Comments:  LBP is better but upper back is still bothering her.  Initial:}    2/10Post Session:       0/10  Medications Last Reviewed:  03/04/2021  Updated Objective Findings:  None Today  Treatment   THERAPEUTIC EXERCISE: (40 minutes):    Exercises per grid below to improve mobility,  strength, balance, and coordination.  Required minimal verbal and tactile cues to promote proper body alignment, promote proper body posture, and promote proper body mechanics.  Progressed resistance, range, repetitions, and complexity of movement as indicated.  MANUAL THERAPY: (0 minutes):   Joint mobilization and Soft tissue mobilization was utilized and necessary because of the patient's restricted joint motion, painful spasm, loss of articular motion, and restricted motion of soft tissue.   MODALITIES: (0 minutes):              Date:  01/20/21 Date:  01/27/21 Date:  02/18/21 Date:  02/25/21 Date:  03/04/21   Activity/Exercise Parameters Parameters Parameters Parameters Parameters   NuStep  10 mins, 2.5 10 mins, 3.0  10 mins, 1.0  10 mins, 2.5   TA 10x 20x w/ ball 20x w/ ball  10x SL  20x w/ ball  10x SL 20x w/ ball  10x SL   Bridge 10x 20x  20x  20x 20x    SLR  2x10 B      Clamshell 10x B lime band 15x B lime band 20x B lime band   2x10 B lime band   LTR 10x   10x  10x    KTC    10x B 10x B   Piriformis stretch  5x B  10x B 10x B   Butterfly stretch    10x seated  10x seated   Hip flexor stretch 5x B standing at counter   10x B 10x  B   Step up  Forwards and sideways 10x  Forwards and sideways 10x B 6 inch step     Mini squat  10x in // 10x in //     Banded walk  62ftx 6 forward and sideways      3 way hip  10x B forwards, sideways, backwards 20x B forwards, sideways, backwards      Hamstring stretch   10x B seated         Treatment/Session Summary:    Treatment Assessment:  See progress/recert report. Overall she is much improved pain and strength. New HEP issued today. Pt may schedule more visits for the sessions she missed if able with work schedule.  Communication/Consultation:  None today  Equipment provided today:  HEP  Recommendations/Intent for next treatment session: Next visit will focus on progression of functional tasks as tolerated.    Total Treatment Billable Duration:  40 minutes therex  Time In:  1300  Time Out: 1340    Melburn Popper, PT       Charge Capture  }Post Session Pain  PT Visit Info  Med[facility] Portal  MD Guidelines  Scanned Media  Benefits  MyChart    No future appointments.  Access Code: [email or web address]  URL: [email or web address]  Date: 03/04/2021  Prepared by: Melburn Popper    Exercises  Supine Transversus Abdominis Bracing - Hands on Thighs - 1 x daily - 7 x weekly - 2 sets - 10 reps - 5 hold  Supine Lower Trunk Rotation - 1 x daily - 7 x weekly - 1 sets - 10 reps - 5 hold  Hooklying Single Knee to Chest Stretch - 1 x daily - 7 x weekly - 1 sets - 10 reps - 5 hold  Supine Piriformis Stretch with Foot on Ground - 1 x daily - 7 x weekly - 1 sets - 10 reps - 5 hold  Supine Butterfly Groin Stretch - 1 x daily - 7 x weekly - 1 sets - 10 reps - 5 hold  Standing Hip Flexor Stretch - 1 x daily - 7 x weekly - 1 sets - 10 reps - 5 hold  Supine Bridge - 1 x daily - 3 x weekly - 2 sets - 10 reps - 5 hold  Active Straight Leg Raise with Quad Set - 1 x daily - 3 x weekly - 2 sets - 10 reps - 5 hold  Clam with Resistance - 1 x daily - 3 x weekly - 2 sets - 10 reps - 5 hold

## 2021-03-04 NOTE — Other (Signed)
Cindy Klein  DOB: [birth year]  Primary: [facility]  Secondary:  [facility]  [address]  [place] [address]  Phone: [phone]  Fax: [phone] Plan Frequency: 3-2I/ZT  Plan of Care/Certification Expiration Date: 05/05/21      PT Visit Info:    Total # of Visits to Date: 5  No Show: 1  Canceled Appointment: 3      Visit Count:  5                OUTPATIENT PHYSICAL THERAPY:             OP NOTE TYPE: Recertification 24/58/0998               Episode  Appt Desk         Treatment Diagnosis:  Pain in Left Knee (M25.562)  Low Back Pain (M54.5)  Medical/Referring Diagnosis:  Low back pain, unspecified back pain laterality, unspecified chronicity, unspecified whether sciatica present [M54.50]  DDD (degenerative disc disease), lumbar [M51.36]  Neuropathic pain of left lower extremity [M79.2]  Meralgia paresthetica of left side [G57.12]  Referring Physician:  Stark Bray  MD Orders:  PT Eval and Treat, LBP and hip pain, 2-3 times week for 4-5 weeks  Return MD Appt:  TBD  Date of Onset:  Onset Date:  (7-8 years ago)      Allergies:  Hydroxyzine hcl, Other, Cephalexin, Gabapentin, Hydrocodone, Morphine and related, Penicillins, Sulfa antibiotics, and Adhesive tape  Restrictions/Precautions:    Restrictions/Precautions: None      Medications Last Reviewed:  03/04/2021     SUBJECTIVE   History of Injury/Illness (Reason for Referral):  Pt fell getting off bus in [place] and landed on knee and has had pain, swelling, and numbness in L thigh and knee since then. Is going to get a nerve conduction study soon. Hurts all over except the top of her head.   Patient Stated Goal(s):  "walk w/o waddle, improved pain"  Initial:     2/10 Post Session:     0/10  Past Medical History/Comorbidities:   Cindy Klein  has a past medical history of Kidney stones, Peripheral artery disease ([place]), Peripheral vascular disease ([place]), Primary osteoarthritis of left knee, PTSD (post-traumatic stress  disorder), and TBI (traumatic brain injury).  Cindy Klein  has a past surgical history that includes Cholecystectomy and Knee arthroscopy (Right).             OBJECTIVE   Posture/Deformity: increased lumbar lordosis, thoracic kyphosis, forward head     Date:  01/20/21 Date:  03/04/21 Date:     Hip Flex R: 5/5  L: 4+/5 R: 5/5  L: 5/5    Hip Ext R: 5/5  L: 4-/5 R: 5/5  L: 4/5     Hip ABD R: 4+/5  L: 4-/5 R: 4+/5  L: 4+/5    Hip ADD R: 4+/5  L: 4-/5     Hip ER R: 4/5  L: 4-/5     Hip IR R: 4/5  L: 4-/5     Knee Ext R: 5/5  L: 4-/5     Knee Flex R: 5/5  L: 5/5     Ankle DF R: 5/5  L: 5/5     Ankle PF R: 5/5  L: 5/5     Lumbar  WFL w/ all motions       Sensation:   Decreased L thigh and foot sensation    Special Test/Function:  Pelvic Obliquity: none   Accessory Mobility/CPAs: tender to lower lumbar spine ~L4-5  SI Compression: tender bilaterally  [place]: no increased strength w/ flexion or extension    ASSESSMENT   Initial Assessment:  Cindy Klein presents to PT eval w/ c/o L sided hip/knee soreness and numbness with occasional LBP. This is chronic in nature from a fall 6-7 years ago. At this time, she displays decreased core strength, balance, proximal hip strength, and mobility. Ms. Averitt will benefit from continued skilled PT services to improve deficits listed below and to progress towards her PLOF.   Updated Assessment: Cindy Klein presented to PT eval w/ L sided hip/knee pain and LBP. Currently, her pain is improved and her strength has increased. She has met 5/6 PT goals and is progressing towards her final goal. Due to her work schedule, she had to miss a few of her originally scheduled visits. Expanding POC at this time in case she needs to return to PT and is able to with her work schedule to continue with progressions made in PT. Thank you for this referral.     Problem List: (Impacting functional limitations):    Body Structures, Functions, Activity Limitations Requiring Skilled Therapeutic  Intervention: Decreased functional mobility ; Decreased ADL status; Decreased ROM; Decreased body mechanics; Decreased strength; Decreased endurance; Decreased balance; Decreased sensation; Increased pain; Decreased posture     Therapy Prognosis:   Therapy Prognosis: Good     Initial Assessment Complexity: Decision Making: Low Complexity    PLAN   Effective Dates: 01/20/21 TO Plan of Care/Certification Expiration Date: 05/05/21     Frequency/Duration: Plan Frequency: 1-2x/wk     Interventions Planned (Treatment may consist of any combination of the following):    Current Treatment Recommendations: Strengthening; ROM; Balance training; Functional mobility training; Transfer training; Endurance training; Personnel officer; Stair training; Manual Therapy - Soft Tissue Mobilization; Manual Therapy - Joint Manipulation; Home exercise program; Modalities; Integrated dry needling     Goals: (Goals have been discussed and agreed upon with patient.)  Short Term Goals: 5 weeks  1. Pt will be independent w/ HEP in order to improve outcomes and decrease pain. MET  2. Pt will report pain of 2/10 or less while performing ADLs in order to improve functional mobility. MET  3. Pt will have B hip flexion strength of 5/5 in order to increase mobility and balance. MET    Long Term Goals: 6 weeks  1. Pt will have Oswestry score of 11 or less in order to report decreased pain and decreased functional impairments. MET  2. Pt will have B hip abduction strength of 4+/5 or greater in order to improve mobility and balance. MET  3. Pt will have B hip extension strength of 4+/5 or greater in order to improve mobility and balance. Progressing           Outcome Measure:   Tool Used: Modified Oswestry Low Back Pain Questionnaire  Score:  Initial: 14/50  Most Recent: 2/50 (Date: 03/04/21 )   Interpretation of Score: Each section is scored on a 0-5 scale, 5 representing the greatest disability.  The scores of each section are added together for a total  score of 50.      Medical Necessity:   > Patient is expected to demonstrate progress in strength, range of motion, balance, and coordination to increase independence with functional tasks.  Reason For Services/Other Comments:  > Patient continues to demonstrate capacity to improve strength, ROM, balance, mobility which will increase  independence.  Total Duration:  Time In: 1300  Time Out: 1340    Regarding Betti Mahnken's therapy, I certify that the treatment plan above will be carried out by a therapist or under their direction.  Thank you for this referral,  Willaim Rayas, PT     Referring Physician Signature: Stark Bray                    Post Session Pain  Charge Capture  PT Visit Info MD Guidelines  MyChart  Access Code: [email or web address]  URL: [email or web address]  Date: 03/04/2021  Prepared by: Willaim Rayas    Exercises  Supine Transversus Abdominis Bracing - Hands on Thighs - 1 x daily - 7 x weekly - 2 sets - 10 reps - 5 hold  Supine Lower Trunk Rotation - 1 x daily - 7 x weekly - 1 sets - 10 reps - 5 hold  Hooklying Single Knee to Chest Stretch - 1 x daily - 7 x weekly - 1 sets - 10 reps - 5 hold  Supine Piriformis Stretch with Foot on Ground - 1 x daily - 7 x weekly - 1 sets - 10 reps - 5 hold  Supine Butterfly Groin Stretch - 1 x daily - 7 x weekly - 1 sets - 10 reps - 5 hold  Standing Hip Flexor Stretch - 1 x daily - 7 x weekly - 1 sets - 10 reps - 5 hold  Supine Bridge - 1 x daily - 3 x weekly - 2 sets - 10 reps - 5 hold  Active Straight Leg Raise with Quad Set - 1 x daily - 3 x weekly - 2 sets - 10 reps - 5 hold  Clam with Resistance - 1 x daily - 3 x weekly - 2 sets - 10 reps - 5 hold

## 2021-03-20 NOTE — Telephone Encounter (Signed)
[facility] requested ref note [id number]

## 2022-06-17 IMAGING — US US EXTREM LOW*L* LIMITED
1 series · 14 of 25 positions shown · non-contrast
Comparison: left lower extremity venous doppler 12/08/2019.

CLINICAL DATA: 49-year-old female with persistent left lower leg
swelling following ankle injury.

EXAM:
ULTRASOUND LEFT LOWER EXTREMITY LIMITED
TECHNIQUE: Ultrasound examination of the lower extremity soft tissues was
performed in the area of clinical concern.

[Series 1: us extrem low*left* limited · 0.12mm/px · 14 of 34 slices shown]
[im 1/34]
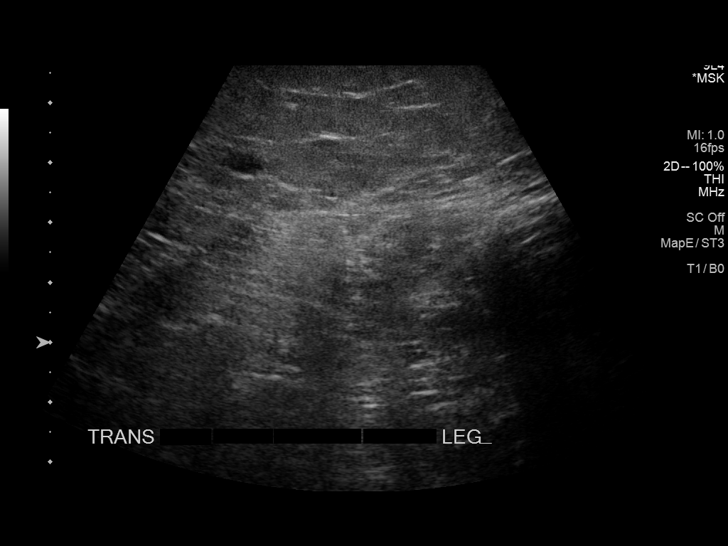
[im 3/34]
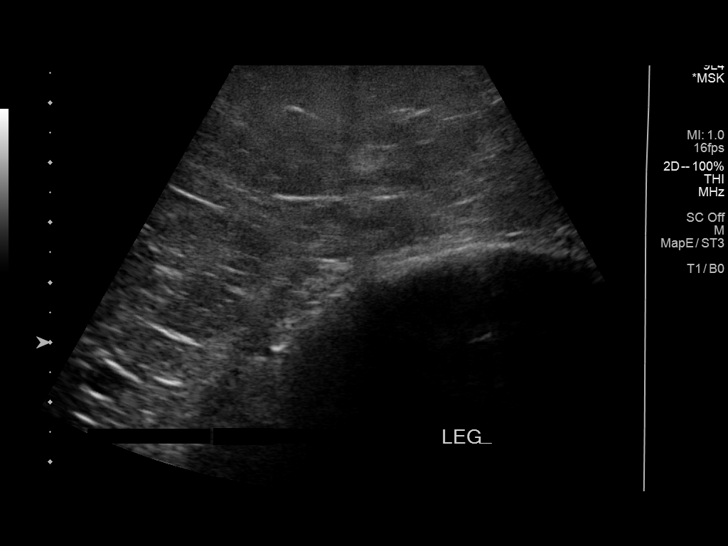
[im 6/34]
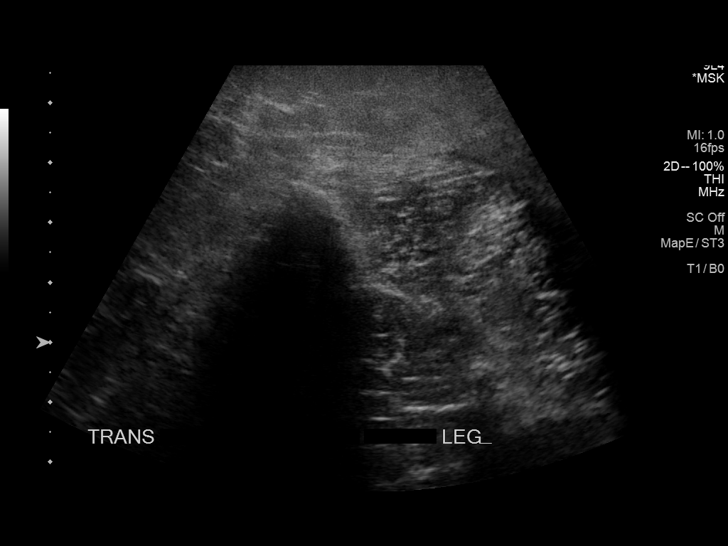
[im 9/34]
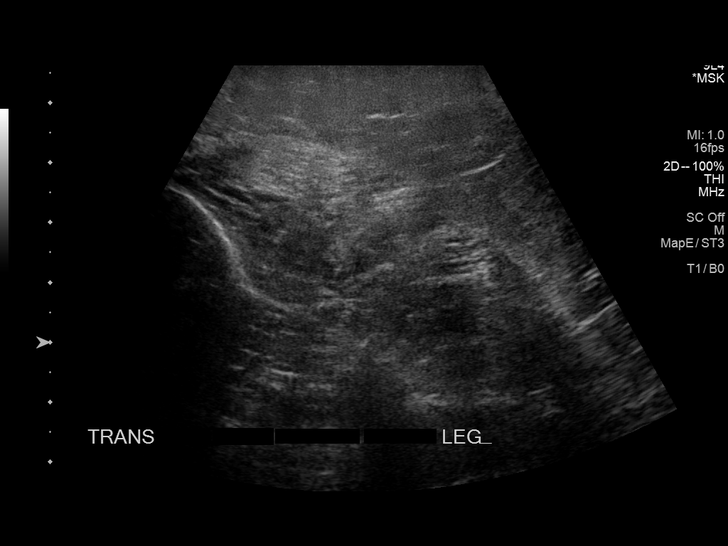
[im 12/34]
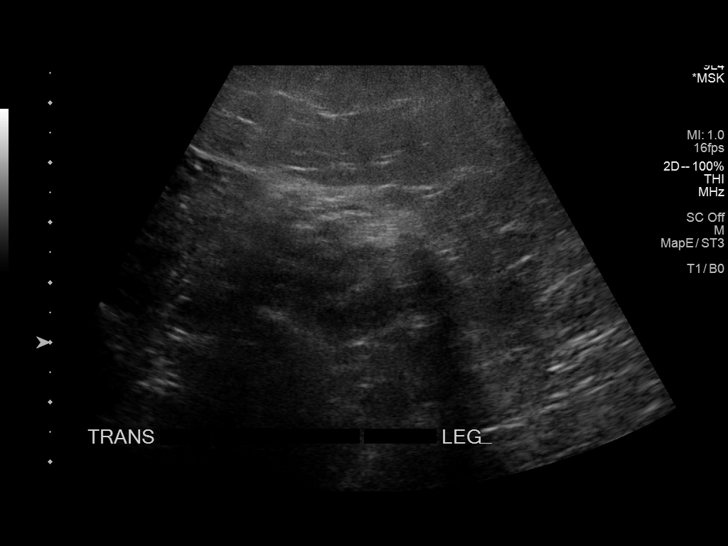
[im 13/34]
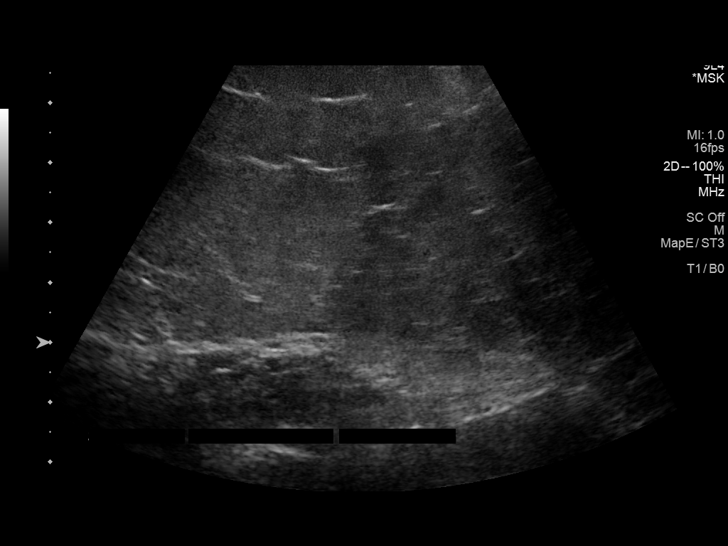
[im 16/34]
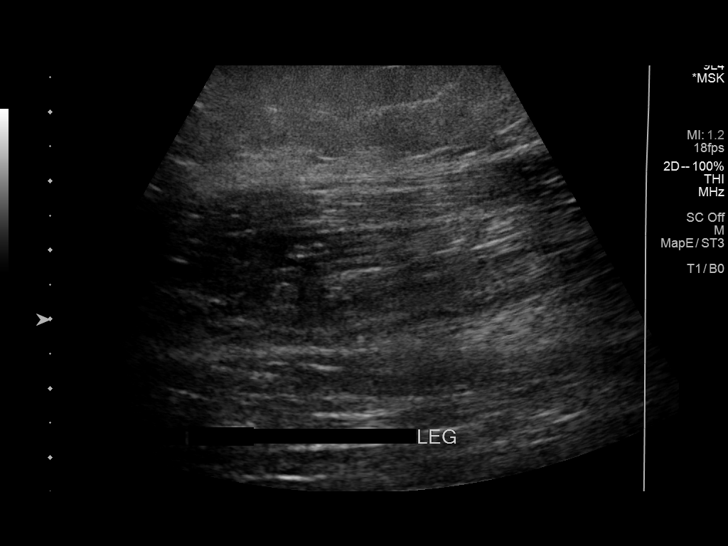
[im 18/34]
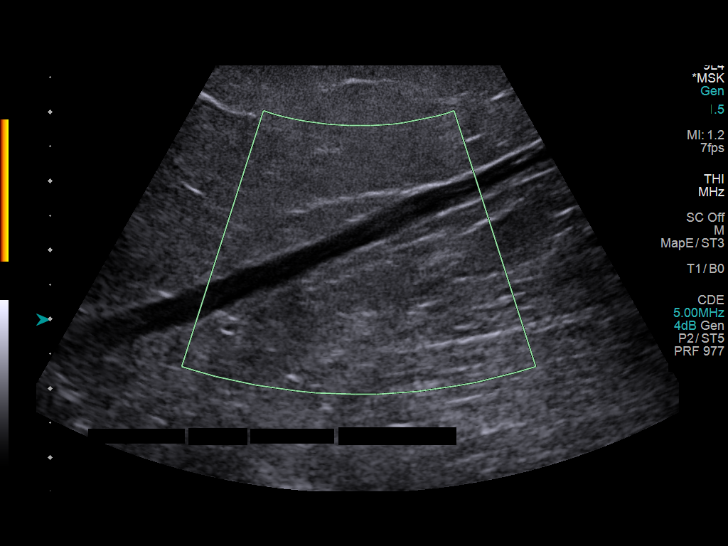
[im 21/34]
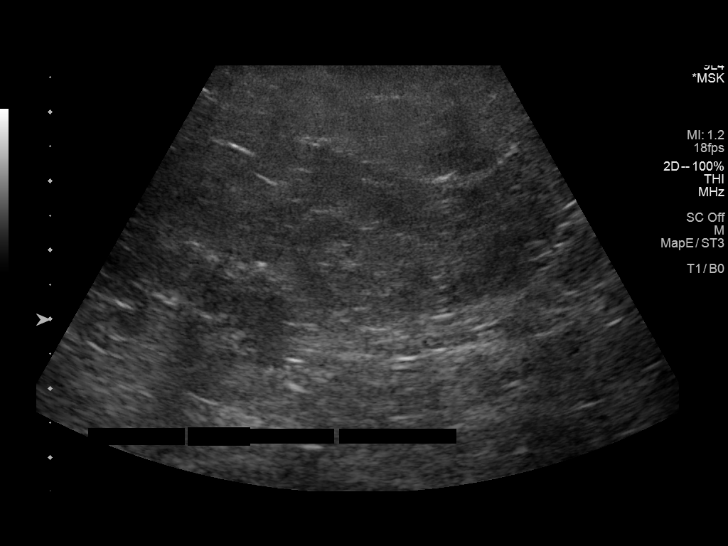
[im 23/34]
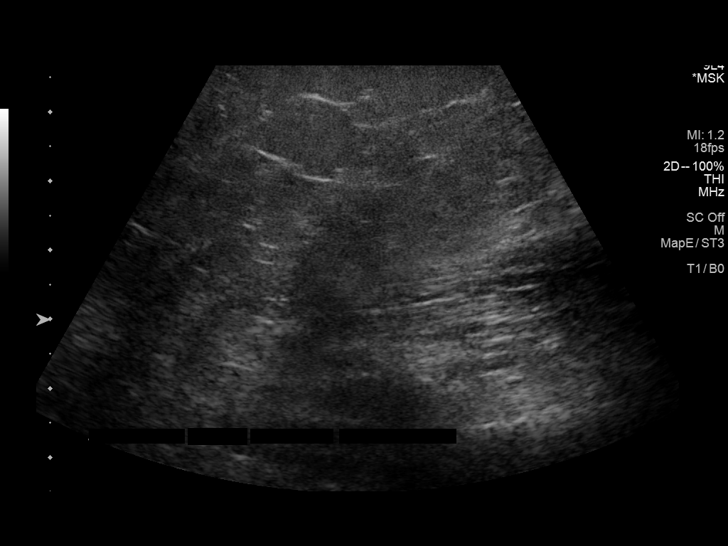
[im 25/34]
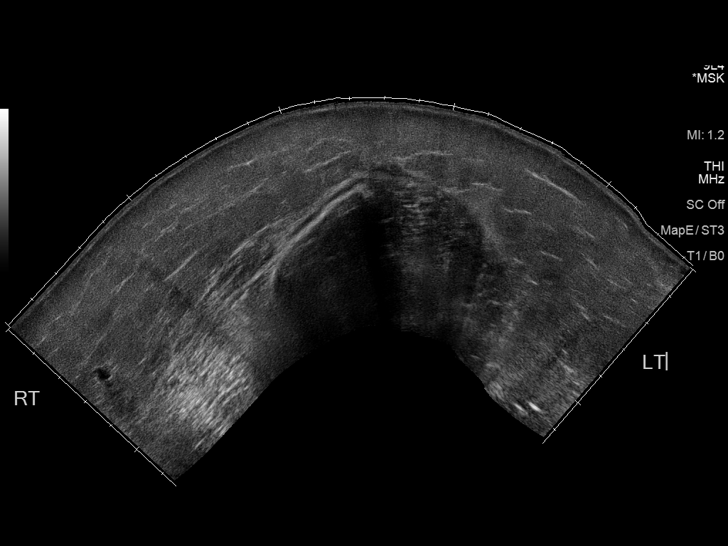
[im 28/34]
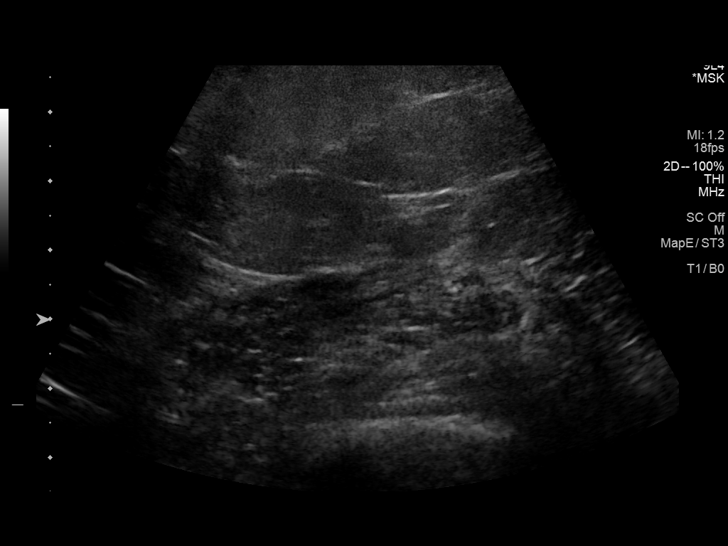
[im 31/34]
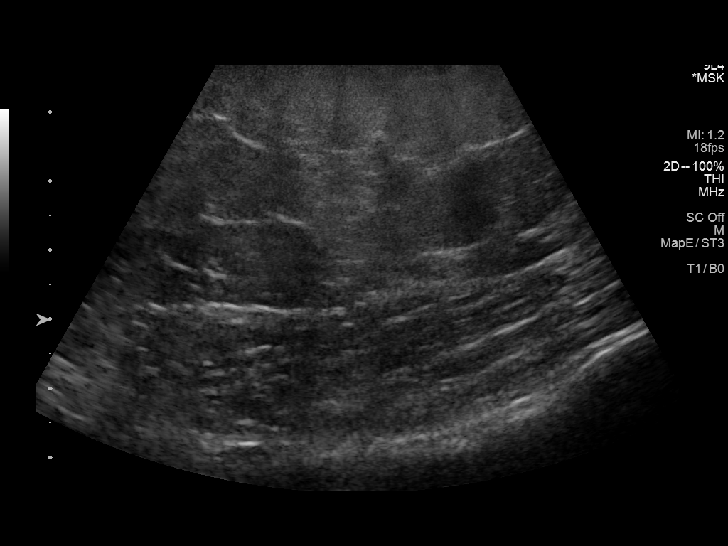
[im 34/34]
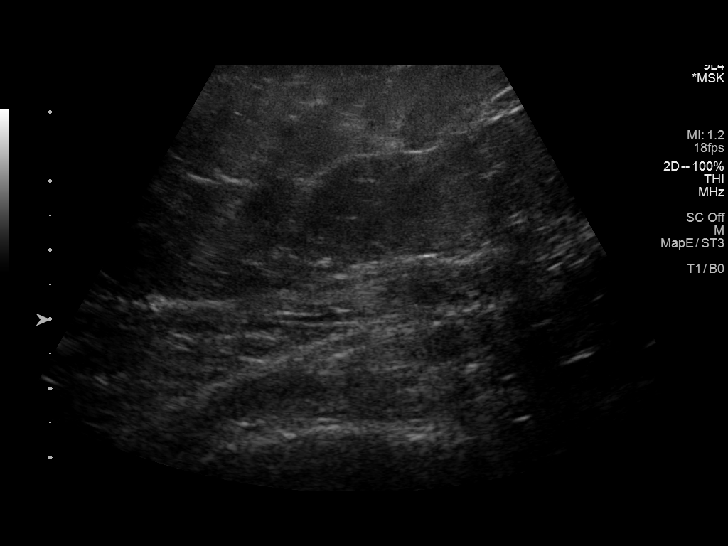

[14 of 25 positions shown; findings below may reference images not displayed]

FINDINGS: Grayscale and brief power Doppler imaging in the area of clinical
concern (image 25). Subcutaneous and underlying fibromuscular
architecture appears maintained throughout the area. Presence of
subcutaneous edema is difficult to exclude, but there is no discrete
mass or collection.
IMPRESSION: No discrete ultrasound abnormality in the area of clinical concern.

## 2022-06-27 IMAGING — MG DIGITAL SCREENING BILAT W/ TOMO W/ CAD
8 series · 8 of 24 positions shown · non-contrast
Comparison: Previous exam(s).

CLINICAL DATA: Screening.

EXAM:
DIGITAL SCREENING BILATERAL MAMMOGRAM WITH TOMO AND CAD

[L CC synth-2D]
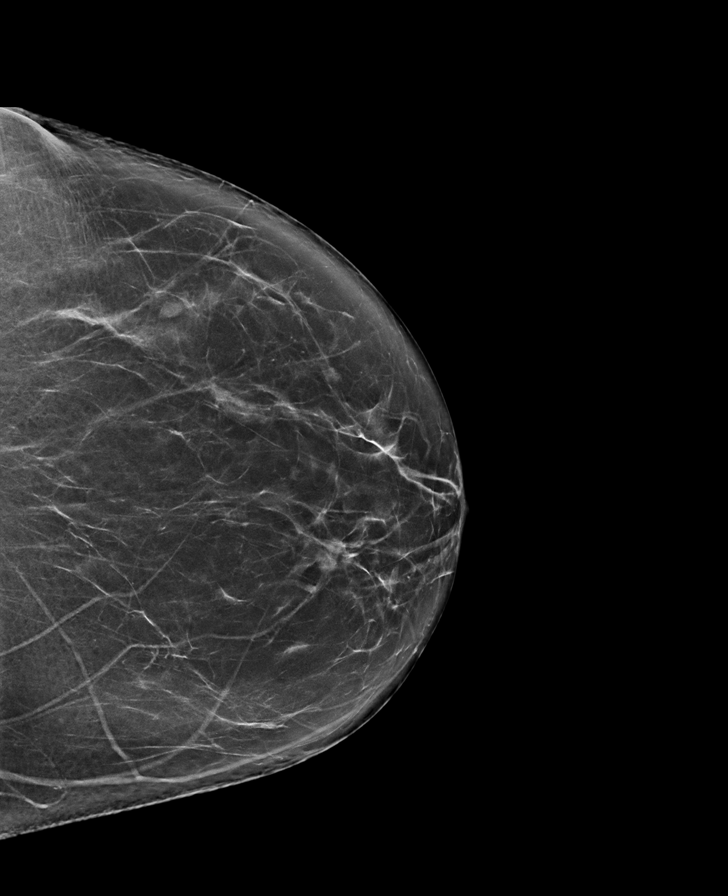

[R MLO synth-2D]
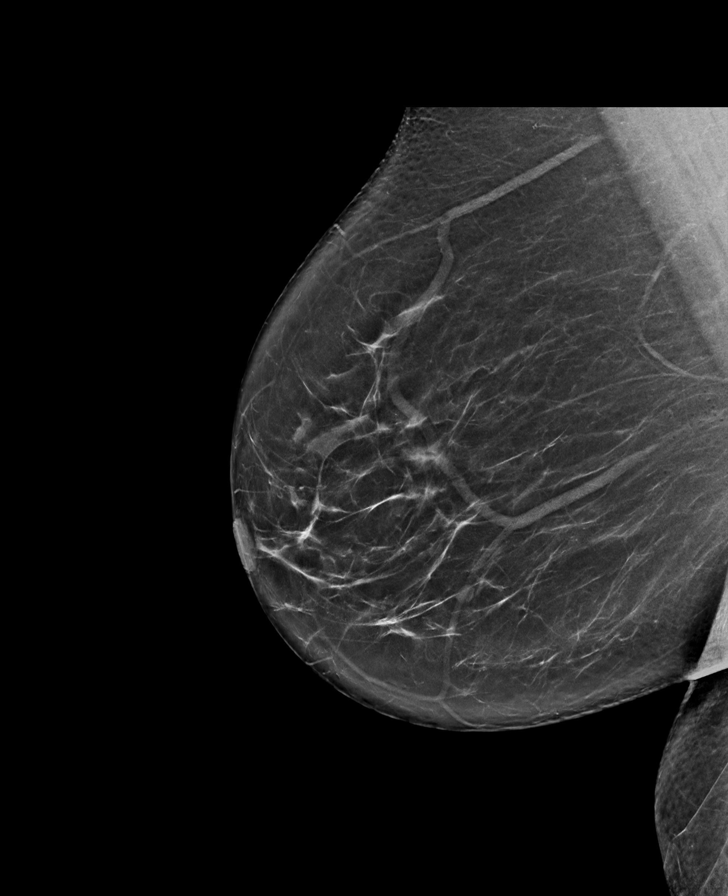

[R CC synth-2D]
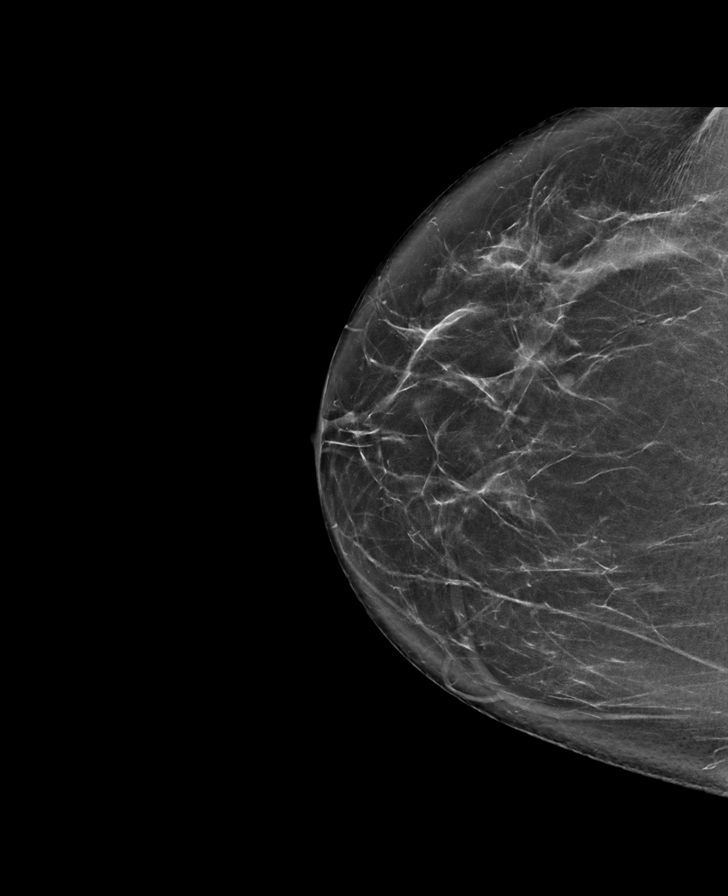

[L MLO synth-2D]
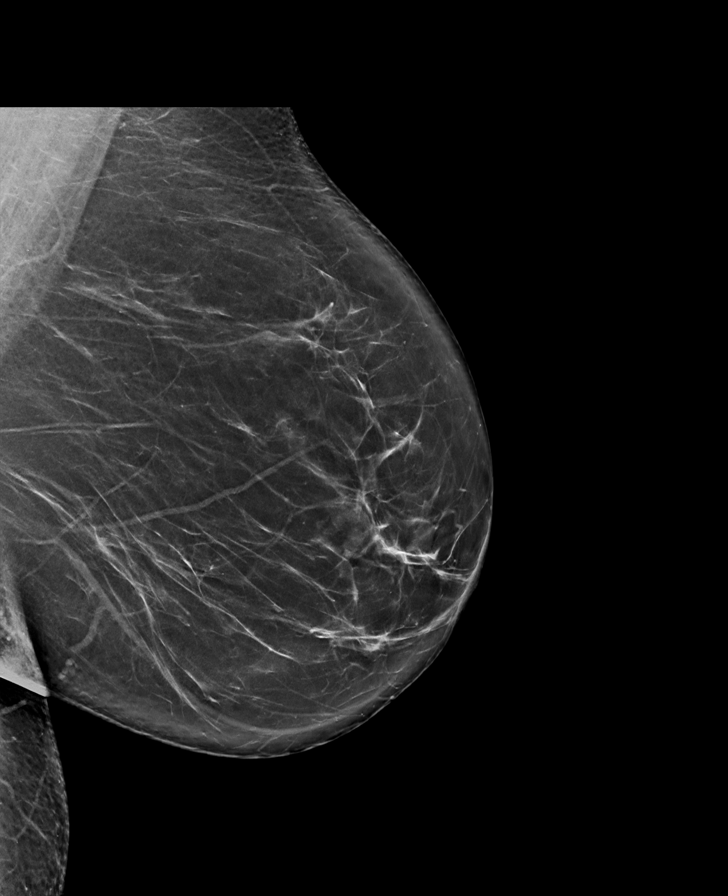

[L CC tomo · tomo slice 39/77.0]
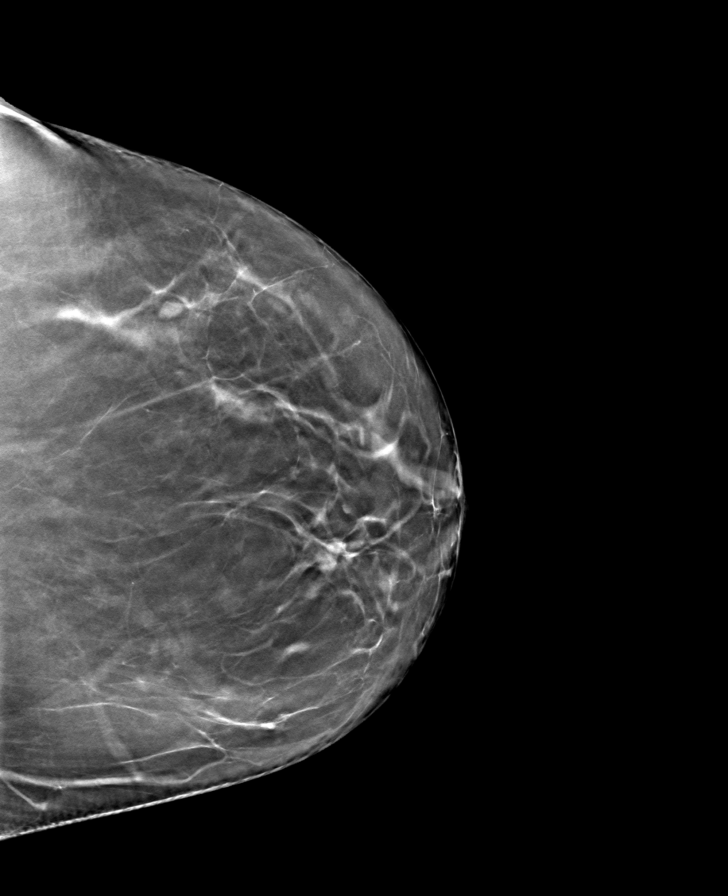

[L MLO tomo · tomo slice 43/84.0]
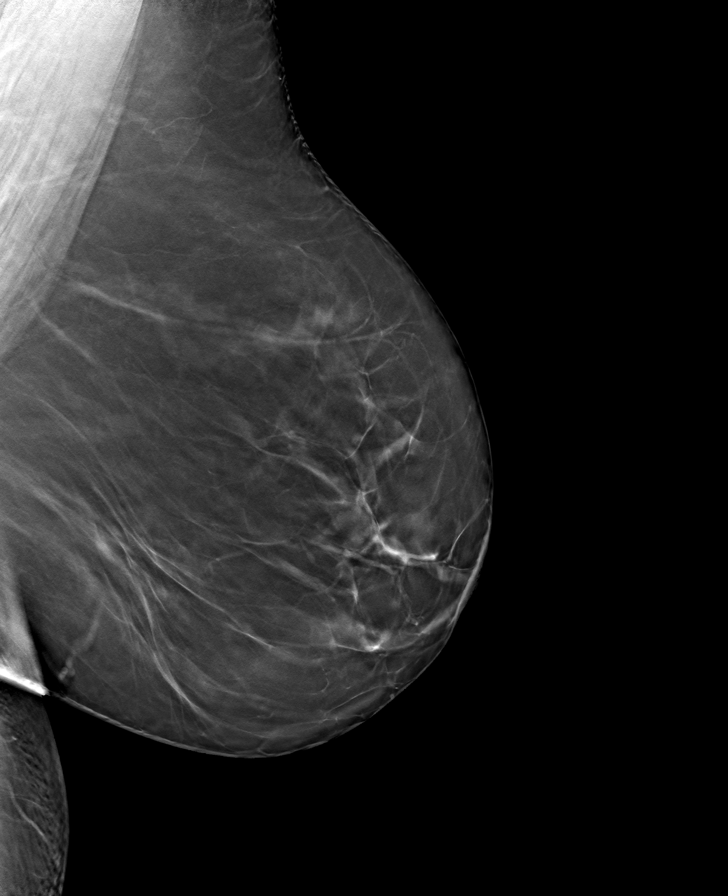

[R MLO tomo · tomo slice 41/82.0]
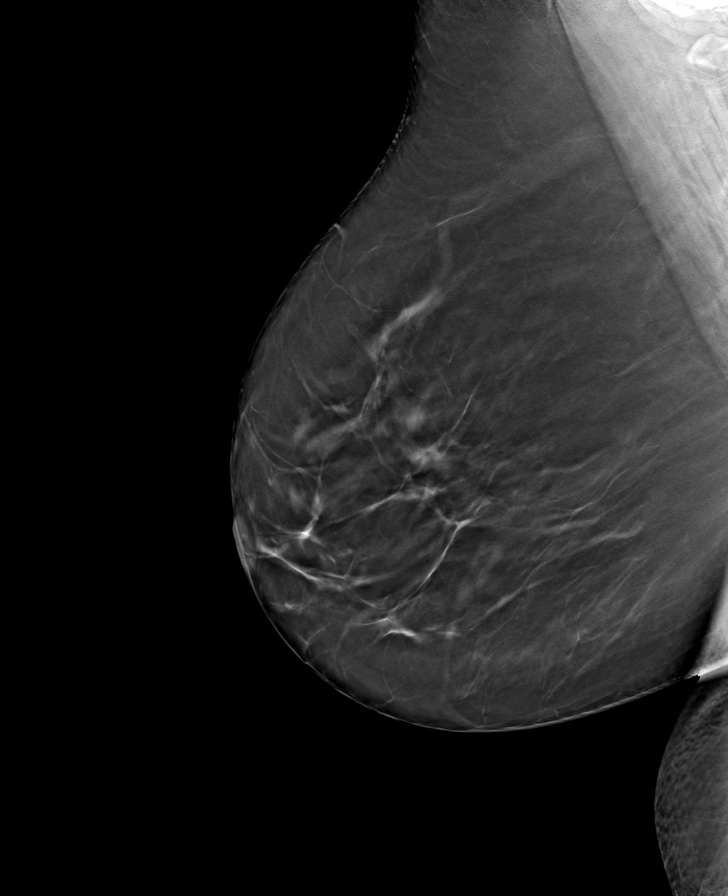

[R CC tomo · tomo slice 39/76.0]
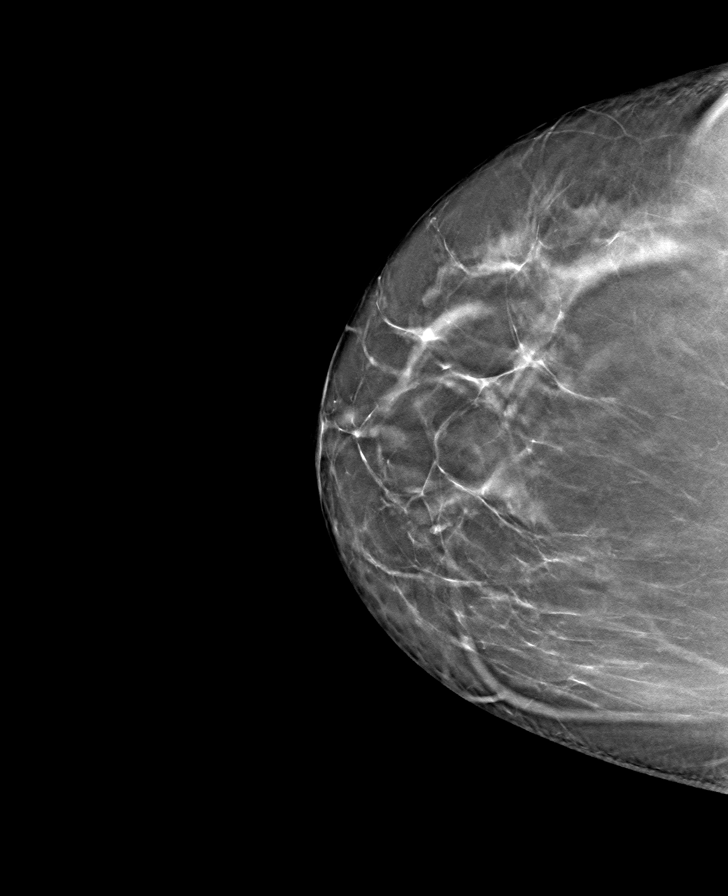

[8 of 24 positions shown; findings below may reference images not displayed]

ACR Breast Density Category b: There are scattered areas of
fibroglandular density.
FINDINGS: There are no findings suspicious for malignancy. Images were
processed with CAD.
IMPRESSION: No mammographic evidence of malignancy. A result letter of this
screening mammogram will be mailed directly to the patient.

RECOMMENDATION:
Screening mammogram in one year. (Code:[id number])

BI-RADS CATEGORY  1: Negative.
